# Patient Record
Sex: Female | Born: 1975 | Race: White | Hispanic: No | Marital: Single | Smoking: Former smoker
Health system: Southern US, Community
[De-identification: ages and names within clinical notes are randomized; demographics above are authoritative.]

## PROBLEM LIST (undated history)

## (undated) ENCOUNTER — Inpatient Hospital Stay (HOSPITAL_COMMUNITY): Payer: Self-pay

## (undated) DIAGNOSIS — O99345 Other mental disorders complicating the puerperium: Secondary | ICD-10-CM

## (undated) DIAGNOSIS — N390 Urinary tract infection, site not specified: Secondary | ICD-10-CM

## (undated) DIAGNOSIS — F53 Postpartum depression: Secondary | ICD-10-CM

## (undated) DIAGNOSIS — F418 Other specified anxiety disorders: Secondary | ICD-10-CM

## (undated) DIAGNOSIS — E785 Hyperlipidemia, unspecified: Secondary | ICD-10-CM

## (undated) HISTORY — DX: Other specified anxiety disorders: F41.8

## (undated) HISTORY — DX: Other mental disorders complicating the puerperium: O99.345

## (undated) HISTORY — DX: Hyperlipidemia, unspecified: E78.5

## (undated) HISTORY — DX: Postpartum depression: F53.0

---

## 2000-05-25 ENCOUNTER — Emergency Department (HOSPITAL_COMMUNITY): Admission: EM | Admit: 2000-05-25 | Discharge: 2000-05-25 | Payer: Self-pay | Admitting: Internal Medicine

## 2000-06-01 ENCOUNTER — Encounter: Admission: RE | Admit: 2000-06-01 | Discharge: 2000-06-01 | Payer: Self-pay | Admitting: Family Medicine

## 2000-06-26 ENCOUNTER — Encounter: Admission: RE | Admit: 2000-06-26 | Discharge: 2000-06-26 | Payer: Self-pay | Admitting: Sports Medicine

## 2000-07-05 ENCOUNTER — Ambulatory Visit (HOSPITAL_COMMUNITY): Admission: RE | Admit: 2000-07-05 | Discharge: 2000-07-05 | Payer: Self-pay | Admitting: Sports Medicine

## 2000-07-26 ENCOUNTER — Encounter: Admission: RE | Admit: 2000-07-26 | Discharge: 2000-07-26 | Payer: Self-pay | Admitting: Family Medicine

## 2000-08-13 ENCOUNTER — Inpatient Hospital Stay (HOSPITAL_COMMUNITY): Admission: AD | Admit: 2000-08-13 | Discharge: 2000-08-13 | Payer: Self-pay | Admitting: Obstetrics

## 2000-08-20 ENCOUNTER — Encounter: Admission: RE | Admit: 2000-08-20 | Discharge: 2000-08-20 | Payer: Self-pay | Admitting: Family Medicine

## 2000-09-04 ENCOUNTER — Encounter: Admission: RE | Admit: 2000-09-04 | Discharge: 2000-09-04 | Payer: Self-pay | Admitting: Sports Medicine

## 2000-09-20 ENCOUNTER — Inpatient Hospital Stay (HOSPITAL_COMMUNITY): Admission: AD | Admit: 2000-09-20 | Discharge: 2000-09-20 | Payer: Self-pay | Admitting: Obstetrics & Gynecology

## 2000-09-20 ENCOUNTER — Encounter: Admission: RE | Admit: 2000-09-20 | Discharge: 2000-09-20 | Payer: Self-pay | Admitting: Family Medicine

## 2000-09-28 ENCOUNTER — Encounter: Admission: RE | Admit: 2000-09-28 | Discharge: 2000-09-28 | Payer: Self-pay | Admitting: Family Medicine

## 2000-10-03 ENCOUNTER — Encounter: Payer: Self-pay | Admitting: Obstetrics

## 2000-10-03 ENCOUNTER — Inpatient Hospital Stay (HOSPITAL_COMMUNITY): Admission: AD | Admit: 2000-10-03 | Discharge: 2000-10-03 | Payer: Self-pay | Admitting: Obstetrics

## 2000-10-03 ENCOUNTER — Encounter: Admission: RE | Admit: 2000-10-03 | Discharge: 2000-10-03 | Payer: Self-pay | Admitting: Family Medicine

## 2000-10-10 ENCOUNTER — Encounter: Admission: RE | Admit: 2000-10-10 | Discharge: 2000-10-10 | Payer: Self-pay | Admitting: Family Medicine

## 2000-10-17 ENCOUNTER — Observation Stay (HOSPITAL_COMMUNITY): Admission: AD | Admit: 2000-10-17 | Discharge: 2000-10-17 | Payer: Self-pay | Admitting: *Deleted

## 2000-10-26 ENCOUNTER — Encounter: Admission: RE | Admit: 2000-10-26 | Discharge: 2000-10-26 | Payer: Self-pay | Admitting: Family Medicine

## 2000-10-29 ENCOUNTER — Inpatient Hospital Stay (HOSPITAL_COMMUNITY): Admission: AD | Admit: 2000-10-29 | Discharge: 2000-10-31 | Payer: Self-pay | Admitting: Obstetrics

## 2001-02-11 ENCOUNTER — Encounter: Admission: RE | Admit: 2001-02-11 | Discharge: 2001-02-11 | Payer: Self-pay | Admitting: Family Medicine

## 2001-03-05 ENCOUNTER — Encounter: Admission: RE | Admit: 2001-03-05 | Discharge: 2001-03-05 | Payer: Self-pay | Admitting: Family Medicine

## 2001-04-11 ENCOUNTER — Encounter: Admission: RE | Admit: 2001-04-11 | Discharge: 2001-04-11 | Payer: Self-pay | Admitting: Family Medicine

## 2001-04-16 ENCOUNTER — Ambulatory Visit (HOSPITAL_COMMUNITY): Admission: RE | Admit: 2001-04-16 | Discharge: 2001-04-16 | Payer: Self-pay | Admitting: Family Medicine

## 2001-05-03 ENCOUNTER — Encounter: Admission: RE | Admit: 2001-05-03 | Discharge: 2001-05-03 | Payer: Self-pay | Admitting: Family Medicine

## 2001-07-01 ENCOUNTER — Emergency Department (HOSPITAL_COMMUNITY): Admission: EM | Admit: 2001-07-01 | Discharge: 2001-07-01 | Payer: Self-pay | Admitting: Emergency Medicine

## 2001-10-03 ENCOUNTER — Encounter: Admission: RE | Admit: 2001-10-03 | Discharge: 2001-10-03 | Payer: Self-pay | Admitting: Family Medicine

## 2001-12-13 ENCOUNTER — Other Ambulatory Visit: Admission: RE | Admit: 2001-12-13 | Discharge: 2001-12-13 | Payer: Self-pay | Admitting: Family Medicine

## 2001-12-13 ENCOUNTER — Encounter: Admission: RE | Admit: 2001-12-13 | Discharge: 2001-12-13 | Payer: Self-pay | Admitting: Family Medicine

## 2001-12-13 ENCOUNTER — Encounter (INDEPENDENT_AMBULATORY_CARE_PROVIDER_SITE_OTHER): Payer: Self-pay | Admitting: Specialist

## 2001-12-18 ENCOUNTER — Ambulatory Visit (HOSPITAL_COMMUNITY): Admission: RE | Admit: 2001-12-18 | Discharge: 2001-12-18 | Payer: Self-pay | Admitting: Internal Medicine

## 2002-01-12 ENCOUNTER — Inpatient Hospital Stay (HOSPITAL_COMMUNITY): Admission: AD | Admit: 2002-01-12 | Discharge: 2002-01-12 | Payer: Self-pay | Admitting: *Deleted

## 2002-01-16 ENCOUNTER — Inpatient Hospital Stay (HOSPITAL_COMMUNITY): Admission: AD | Admit: 2002-01-16 | Discharge: 2002-01-18 | Payer: Self-pay | Admitting: *Deleted

## 2002-01-17 ENCOUNTER — Encounter: Payer: Self-pay | Admitting: *Deleted

## 2002-03-12 ENCOUNTER — Encounter: Admission: RE | Admit: 2002-03-12 | Discharge: 2002-03-12 | Payer: Self-pay | Admitting: Family Medicine

## 2002-03-12 ENCOUNTER — Other Ambulatory Visit: Admission: RE | Admit: 2002-03-12 | Discharge: 2002-03-12 | Payer: Self-pay | Admitting: Family Medicine

## 2002-03-12 ENCOUNTER — Encounter (INDEPENDENT_AMBULATORY_CARE_PROVIDER_SITE_OTHER): Payer: Self-pay | Admitting: *Deleted

## 2002-03-28 ENCOUNTER — Encounter: Admission: RE | Admit: 2002-03-28 | Discharge: 2002-03-28 | Payer: Self-pay | Admitting: Family Medicine

## 2002-04-09 ENCOUNTER — Encounter: Admission: RE | Admit: 2002-04-09 | Discharge: 2002-04-09 | Payer: Self-pay | Admitting: Family Medicine

## 2002-05-13 ENCOUNTER — Encounter: Admission: RE | Admit: 2002-05-13 | Discharge: 2002-05-13 | Payer: Self-pay | Admitting: Family Medicine

## 2002-05-17 ENCOUNTER — Inpatient Hospital Stay (HOSPITAL_COMMUNITY): Admission: AD | Admit: 2002-05-17 | Discharge: 2002-05-19 | Payer: Self-pay | Admitting: *Deleted

## 2003-02-16 ENCOUNTER — Encounter: Admission: RE | Admit: 2003-02-16 | Discharge: 2003-02-16 | Payer: Self-pay | Admitting: Family Medicine

## 2003-02-24 ENCOUNTER — Encounter: Admission: RE | Admit: 2003-02-24 | Discharge: 2003-02-24 | Payer: Self-pay | Admitting: Family Medicine

## 2003-03-04 ENCOUNTER — Encounter (INDEPENDENT_AMBULATORY_CARE_PROVIDER_SITE_OTHER): Payer: Self-pay | Admitting: *Deleted

## 2003-03-04 LAB — CONVERTED CEMR LAB

## 2003-03-10 ENCOUNTER — Encounter (INDEPENDENT_AMBULATORY_CARE_PROVIDER_SITE_OTHER): Payer: Self-pay | Admitting: Specialist

## 2003-03-10 ENCOUNTER — Encounter: Admission: RE | Admit: 2003-03-10 | Discharge: 2003-03-10 | Payer: Self-pay | Admitting: Sports Medicine

## 2003-03-10 ENCOUNTER — Other Ambulatory Visit: Admission: RE | Admit: 2003-03-10 | Discharge: 2003-03-10 | Payer: Self-pay | Admitting: Family Medicine

## 2003-03-31 ENCOUNTER — Encounter: Admission: RE | Admit: 2003-03-31 | Discharge: 2003-03-31 | Payer: Self-pay | Admitting: Family Medicine

## 2003-04-15 ENCOUNTER — Encounter: Admission: RE | Admit: 2003-04-15 | Discharge: 2003-04-15 | Payer: Self-pay | Admitting: Family Medicine

## 2003-05-26 ENCOUNTER — Encounter: Admission: RE | Admit: 2003-05-26 | Discharge: 2003-05-26 | Payer: Self-pay | Admitting: Obstetrics and Gynecology

## 2003-06-29 ENCOUNTER — Ambulatory Visit (HOSPITAL_COMMUNITY): Admission: RE | Admit: 2003-06-29 | Discharge: 2003-06-29 | Payer: Self-pay | Admitting: Obstetrics and Gynecology

## 2003-06-29 ENCOUNTER — Encounter (INDEPENDENT_AMBULATORY_CARE_PROVIDER_SITE_OTHER): Payer: Self-pay | Admitting: Specialist

## 2003-10-05 ENCOUNTER — Encounter: Admission: RE | Admit: 2003-10-05 | Discharge: 2003-10-05 | Payer: Self-pay | Admitting: Family Medicine

## 2003-10-22 ENCOUNTER — Encounter: Admission: RE | Admit: 2003-10-22 | Discharge: 2003-10-22 | Payer: Self-pay | Admitting: Obstetrics and Gynecology

## 2004-01-05 ENCOUNTER — Ambulatory Visit: Payer: Self-pay | Admitting: Obstetrics and Gynecology

## 2004-04-19 ENCOUNTER — Encounter (INDEPENDENT_AMBULATORY_CARE_PROVIDER_SITE_OTHER): Payer: Self-pay | Admitting: *Deleted

## 2004-04-19 ENCOUNTER — Ambulatory Visit: Payer: Self-pay | Admitting: Obstetrics and Gynecology

## 2004-10-04 ENCOUNTER — Ambulatory Visit: Payer: Self-pay | Admitting: Obstetrics & Gynecology

## 2004-10-04 ENCOUNTER — Encounter (INDEPENDENT_AMBULATORY_CARE_PROVIDER_SITE_OTHER): Payer: Self-pay | Admitting: Specialist

## 2004-10-14 ENCOUNTER — Ambulatory Visit: Payer: Self-pay | Admitting: Family Medicine

## 2004-10-18 ENCOUNTER — Ambulatory Visit: Payer: Self-pay | Admitting: Sports Medicine

## 2005-01-31 ENCOUNTER — Encounter (INDEPENDENT_AMBULATORY_CARE_PROVIDER_SITE_OTHER): Payer: Self-pay | Admitting: *Deleted

## 2005-01-31 ENCOUNTER — Ambulatory Visit: Payer: Self-pay | Admitting: Obstetrics and Gynecology

## 2005-10-22 ENCOUNTER — Emergency Department (HOSPITAL_COMMUNITY): Admission: EM | Admit: 2005-10-22 | Discharge: 2005-10-22 | Payer: Self-pay | Admitting: Emergency Medicine

## 2006-04-27 ENCOUNTER — Encounter (INDEPENDENT_AMBULATORY_CARE_PROVIDER_SITE_OTHER): Payer: Self-pay | Admitting: *Deleted

## 2006-10-23 ENCOUNTER — Emergency Department (HOSPITAL_COMMUNITY): Admission: EM | Admit: 2006-10-23 | Discharge: 2006-10-23 | Payer: Self-pay | Admitting: Emergency Medicine

## 2008-06-28 ENCOUNTER — Inpatient Hospital Stay (HOSPITAL_COMMUNITY): Admission: AD | Admit: 2008-06-28 | Discharge: 2008-06-28 | Payer: Self-pay | Admitting: Obstetrics & Gynecology

## 2008-06-28 ENCOUNTER — Encounter: Payer: Self-pay | Admitting: Family Medicine

## 2008-07-14 ENCOUNTER — Ambulatory Visit: Payer: Self-pay | Admitting: Family Medicine

## 2008-07-14 ENCOUNTER — Encounter: Payer: Self-pay | Admitting: Family Medicine

## 2008-07-14 LAB — CONVERTED CEMR LAB
Antibody Screen: NEGATIVE
Basophils Absolute: 0 10*3/uL (ref 0.0–0.1)
Basophils Relative: 0 % (ref 0–1)
Eosinophils Absolute: 0.1 10*3/uL (ref 0.0–0.7)
Eosinophils Relative: 2 % (ref 0–5)
HCT: 36.6 % (ref 36.0–46.0)
Hemoglobin: 12.4 g/dL (ref 12.0–15.0)
Hepatitis B Surface Ag: NEGATIVE
Lymphocytes Relative: 23 % (ref 12–46)
Lymphs Abs: 1.5 10*3/uL (ref 0.7–4.0)
MCHC: 33.9 g/dL (ref 30.0–36.0)
MCV: 87.6 fL (ref 78.0–100.0)
Monocytes Absolute: 0.4 10*3/uL (ref 0.1–1.0)
Monocytes Relative: 6 % (ref 3–12)
Neutro Abs: 4.6 10*3/uL (ref 1.7–7.7)
Neutrophils Relative %: 69 % (ref 43–77)
Platelets: 268 10*3/uL (ref 150–400)
RBC: 4.18 M/uL (ref 3.87–5.11)
RDW: 13.7 % (ref 11.5–15.5)
Rh Type: POSITIVE
Rubella: 59.2 intl units/mL — ABNORMAL HIGH
Sickle Cell Screen: NEGATIVE
WBC: 6.6 10*3/uL (ref 4.0–10.5)

## 2008-07-21 ENCOUNTER — Encounter: Payer: Self-pay | Admitting: Family Medicine

## 2008-07-21 ENCOUNTER — Ambulatory Visit: Payer: Self-pay | Admitting: Family Medicine

## 2008-07-21 ENCOUNTER — Other Ambulatory Visit: Admission: RE | Admit: 2008-07-21 | Discharge: 2008-07-21 | Payer: Self-pay | Admitting: Family Medicine

## 2008-07-21 DIAGNOSIS — N390 Urinary tract infection, site not specified: Secondary | ICD-10-CM | POA: Insufficient documentation

## 2008-07-21 LAB — CONVERTED CEMR LAB
Chlamydia, DNA Probe: NEGATIVE
GC Probe Amp, Genital: NEGATIVE

## 2008-07-23 ENCOUNTER — Encounter: Payer: Self-pay | Admitting: Family Medicine

## 2008-08-11 ENCOUNTER — Encounter: Payer: Self-pay | Admitting: Family Medicine

## 2008-08-11 ENCOUNTER — Ambulatory Visit: Payer: Self-pay | Admitting: Family Medicine

## 2008-08-11 LAB — CONVERTED CEMR LAB
Bilirubin Urine: NEGATIVE
Blood in Urine, dipstick: NEGATIVE
Epithelial cells, urine: >20 /LPF
Glucose, Urine, Semiquant: NEGATIVE
Ketones, urine, test strip: NEGATIVE
Nitrite: NEGATIVE
Protein, U semiquant: 30
Specific Gravity, Urine: 1.02
Urobilinogen, UA: 0.2
WBC Urine, dipstick: NEGATIVE
pH: 7.5

## 2008-08-27 ENCOUNTER — Encounter: Payer: Self-pay | Admitting: *Deleted

## 2008-09-02 ENCOUNTER — Encounter: Payer: Self-pay | Admitting: Family Medicine

## 2008-09-02 ENCOUNTER — Ambulatory Visit (HOSPITAL_COMMUNITY): Admission: RE | Admit: 2008-09-02 | Discharge: 2008-09-02 | Payer: Self-pay | Admitting: Family Medicine

## 2008-09-03 ENCOUNTER — Encounter: Payer: Self-pay | Admitting: Family Medicine

## 2008-09-03 ENCOUNTER — Ambulatory Visit: Payer: Self-pay | Admitting: Family Medicine

## 2008-10-26 ENCOUNTER — Ambulatory Visit: Payer: Self-pay | Admitting: Family Medicine

## 2008-10-26 ENCOUNTER — Encounter: Payer: Self-pay | Admitting: Family Medicine

## 2008-10-26 LAB — CONVERTED CEMR LAB
HCT: 34.5 % — ABNORMAL LOW (ref 36.0–46.0)
Hemoglobin: 10.9 g/dL — ABNORMAL LOW (ref 12.0–15.0)
MCHC: 31.6 g/dL (ref 30.0–36.0)
MCV: 92 fL (ref 78.0–100.0)
Platelets: 266 10*3/uL (ref 150–400)
RBC: 3.75 M/uL — ABNORMAL LOW (ref 3.87–5.11)
RDW: 14.1 % (ref 11.5–15.5)
WBC: 7.1 10*3/uL (ref 4.0–10.5)

## 2008-11-09 ENCOUNTER — Ambulatory Visit: Payer: Self-pay | Admitting: Family Medicine

## 2008-11-24 ENCOUNTER — Encounter: Payer: Self-pay | Admitting: Family Medicine

## 2008-11-24 ENCOUNTER — Ambulatory Visit: Payer: Self-pay | Admitting: Family Medicine

## 2008-12-07 ENCOUNTER — Inpatient Hospital Stay (HOSPITAL_COMMUNITY): Admission: AD | Admit: 2008-12-07 | Discharge: 2008-12-07 | Payer: Self-pay | Admitting: Obstetrics & Gynecology

## 2008-12-07 ENCOUNTER — Ambulatory Visit: Payer: Self-pay | Admitting: Obstetrics and Gynecology

## 2008-12-09 ENCOUNTER — Ambulatory Visit: Payer: Self-pay | Admitting: Family Medicine

## 2008-12-18 ENCOUNTER — Encounter: Payer: Self-pay | Admitting: Family Medicine

## 2008-12-18 ENCOUNTER — Ambulatory Visit: Payer: Self-pay | Admitting: Family Medicine

## 2008-12-18 LAB — CONVERTED CEMR LAB
Chlamydia, DNA Probe: NEGATIVE
GC Probe Amp, Genital: NEGATIVE

## 2008-12-19 ENCOUNTER — Ambulatory Visit: Payer: Self-pay | Admitting: Advanced Practice Midwife

## 2008-12-19 ENCOUNTER — Inpatient Hospital Stay (HOSPITAL_COMMUNITY): Admission: AD | Admit: 2008-12-19 | Discharge: 2008-12-19 | Payer: Self-pay | Admitting: Family Medicine

## 2008-12-30 ENCOUNTER — Ambulatory Visit: Payer: Self-pay | Admitting: Family Medicine

## 2009-01-05 ENCOUNTER — Inpatient Hospital Stay (HOSPITAL_COMMUNITY): Admission: AD | Admit: 2009-01-05 | Discharge: 2009-01-06 | Payer: Self-pay | Admitting: Obstetrics & Gynecology

## 2009-01-05 ENCOUNTER — Ambulatory Visit: Payer: Self-pay | Admitting: Obstetrics and Gynecology

## 2009-01-11 ENCOUNTER — Encounter: Payer: Self-pay | Admitting: Family Medicine

## 2009-01-11 ENCOUNTER — Telehealth: Payer: Self-pay | Admitting: *Deleted

## 2009-02-10 ENCOUNTER — Encounter: Payer: Self-pay | Admitting: Family Medicine

## 2009-02-10 ENCOUNTER — Ambulatory Visit: Payer: Self-pay | Admitting: Family Medicine

## 2009-02-10 LAB — CONVERTED CEMR LAB
Chlamydia, DNA Probe: NEGATIVE
GC Probe Amp, Genital: NEGATIVE
Whiff Test: POSITIVE

## 2009-02-16 ENCOUNTER — Encounter: Payer: Self-pay | Admitting: Family Medicine

## 2009-04-20 ENCOUNTER — Encounter: Payer: Self-pay | Admitting: Family Medicine

## 2009-04-20 ENCOUNTER — Ambulatory Visit (HOSPITAL_COMMUNITY): Admission: RE | Admit: 2009-04-20 | Discharge: 2009-04-20 | Payer: Self-pay | Admitting: Family Medicine

## 2009-04-20 ENCOUNTER — Ambulatory Visit: Payer: Self-pay | Admitting: Family Medicine

## 2009-04-20 DIAGNOSIS — R079 Chest pain, unspecified: Secondary | ICD-10-CM | POA: Insufficient documentation

## 2009-04-20 LAB — CONVERTED CEMR LAB: Pro B Natriuretic peptide (BNP): 12.6 pg/mL (ref 0.0–100.0)

## 2009-04-22 ENCOUNTER — Encounter: Payer: Self-pay | Admitting: Family Medicine

## 2009-05-13 ENCOUNTER — Ambulatory Visit: Payer: Self-pay | Admitting: Family Medicine

## 2009-06-09 ENCOUNTER — Telehealth: Payer: Self-pay | Admitting: Family Medicine

## 2009-06-14 ENCOUNTER — Encounter: Payer: Self-pay | Admitting: Family Medicine

## 2009-06-14 ENCOUNTER — Ambulatory Visit: Payer: Self-pay | Admitting: Family Medicine

## 2009-07-20 ENCOUNTER — Emergency Department (HOSPITAL_COMMUNITY): Admission: EM | Admit: 2009-07-20 | Discharge: 2009-07-20 | Payer: Self-pay | Admitting: Emergency Medicine

## 2009-08-03 ENCOUNTER — Encounter: Payer: Self-pay | Admitting: *Deleted

## 2009-08-03 ENCOUNTER — Encounter: Payer: Self-pay | Admitting: Family Medicine

## 2009-08-09 ENCOUNTER — Telehealth: Payer: Self-pay | Admitting: *Deleted

## 2009-10-08 ENCOUNTER — Emergency Department (HOSPITAL_COMMUNITY): Admission: EM | Admit: 2009-10-08 | Discharge: 2009-10-08 | Payer: Self-pay | Admitting: Emergency Medicine

## 2009-10-08 ENCOUNTER — Telehealth (INDEPENDENT_AMBULATORY_CARE_PROVIDER_SITE_OTHER): Payer: Self-pay | Admitting: *Deleted

## 2010-03-28 ENCOUNTER — Ambulatory Visit: Admission: RE | Admit: 2010-03-28 | Discharge: 2010-03-28 | Payer: Self-pay | Source: Home / Self Care

## 2010-03-28 DIAGNOSIS — F411 Generalized anxiety disorder: Secondary | ICD-10-CM | POA: Insufficient documentation

## 2010-03-29 NOTE — Progress Notes (Signed)
Summary: phn msg  Phone Note Call from Patient Call back at [phone]   Caller: Patient Summary of Call: wants to know how soon she can have her tubes tied Initial call taken by: De Nurse,  June 09, 2009 1:34 PM  Follow-up for Phone Call        [facility] Team:  please set up referral to [facility] clinic for tubal ligation.  Order entered. Follow-up by: Romero Belling MD,  June 10, 2009 11:50 AM  New Problems: CONTRACEPTIVE MANAGEMENT (ICD-V25.09)   New Problems: CONTRACEPTIVE MANAGEMENT (ICD-V25.09) will schedule appointment. Theresia Lo RN  June 10, 2009 5:03 PM

## 2010-03-29 NOTE — Assessment & Plan Note (Signed)
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 : approximate (month known)    Menarche: 12 years    Menses interval: irregular days    Menstrual flow irregular days    On BCP's at conception: no    Symptoms since LMP: amenorrhea, nausea, vomiting, fatigue, tender breasts, urinary frequency  Physical Exam  General:  Well-developed,well-nourished,in no acute distress; alert,appropriate and cooperative throughout examination Head:  Normocephalic and atraumatic without obvious abnormalities. No apparent alopecia or balding. Eyes:  PERRL, EOMI Ears:  External ear exam shows no significant lesions or deformities.  Otoscopic examination reveals clear canals, tympanic membranes are intact bilaterally without bulging, retraction, inflammation or discharge. Hearing is grossly normal bilaterally. Nose:  External nasal examination shows no deformity or inflammation. Nasal mucosa are pink and moist without lesions or exudates. Mouth:  Oral mucosa and oropharynx without lesions or exudates.  Teeth in good repair. Neck:  No deformities, masses, or tenderness noted. Lungs:  Clear to auscultation bilateral with normal work of breathing. Heart:  Regular rate and rhythm without murmurs, rubs, or gallops.  Normal precordium. Abdomen:  Bowel sounds positive,abdomen soft and non-tender without masses, organomegaly or hernias noted. Genitalia:  Pelvic Exam:        External: normal female genitalia without lesions or masses        Vagina: normal without lesions or masses        Cervix: normal without lesions  or masses        Adnexa: normal bimanual exam without masses or fullness        Uterus: normal by palpation        Pap smear: performed Msk:  No deformity or scoliosis noted of thoracic or lumbar spine.   Extremities:  No edema or tenderness in bilateral lower extremities. Neurologic:  CN II-XII intact, 5/5 strength x4 extremities, 2+ DTRs x4 extremities, no resting or intention tremor. Skin:  Intact without suspicious lesions or rashes Cervical Nodes:  No lymphadenopathy noted Psych:  Cognition and judgment appear intact. Alert and cooperative with normal attention span and concentration. No apparent delusions, illusions, hallucinations   Vital Signs:  Patient profile:   35 year old female Height:      66 inches Weight:      150.3 pounds Pulse rate:   87 / minute BP sitting:   102 / 72  (left arm) Cuff size:   regular  Vitals Entered By: Garen Grams LPN (Jul 21, 2008 4:16 PM)  CC: NOB Pain Assessment Patient in pain? no      EDC 01/10/2009 LMP - Reliable? approximate (month known) Menarche (age onset): 12 years  Menses interval: irregular days  Menstrual flow (days) irregular On BCP's at conception: no Last PAP Result Done.   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  Employed.  Divorced.  Tobacco 1/3 ppd. (04/26/2006)  Past Pregnancy History    Gravida:     7    Term Births:     3    Premature Births:   0    Living Children:   3    Para:       3    Mult. Births:     0    Prev C-Section:   0    Aborta:     3    Elect. Ab:     3    Spont. Ab:     0    Ectopics:     0  Pregnancy # 1    Delivery date:     01/21/1996    Weeks Gestation:   41    Preterm labor:     no    Delivery type:     NSVD    Hours of labor:     5    Anesthesia type:      None    Delivery location:     [facility]    Infant Sex:     Female    Birth weight:     7#14    Name:     Passion  Pregnancy # 2    Delivery date:     10/29/2000    Weeks Gestation:   38    Preterm labor:     no    Delivery type:     NSVD    Hours of labor:     3    Anesthesia type:     None    Delivery location:     [facility]    Infant Sex:     Female    Birth weight:     6#14    Name:     Dorathy Daft    Comments:     Laceration  Pregnancy # 3    Delivery date:     01/27/2001    Delivery type:     TAB  Pregnancy # 4    Delivery date:     07/28/2001    Delivery type:     TAB  Pregnancy # 5    Delivery date:     05/17/2002    Weeks Gestation:   40    Preterm labor:     no    Delivery type:     NSVD    Hours of labor:     1    Anesthesia type:     None    Delivery location:     [facility]    Infant Sex:     Female    Birth weight:     7#10    Name:     Thermon Leyland  Pregnancy # 6    Delivery date:     07/29/2003    Delivery type:     TAB  Pregnancy # 7    Comments:     Current pregnancy  Social History:    Occupation:  [place]    Education:  College    Hepatitis Risk:  no    Packs/Day:  n/a  Risk Factors:   Genetic History    Father of baby:   Brendia Sacks    FOB Family Hx:     Unknown     Thalassemia:     mother: no    Neural tube defect:   mother: yes  Down's Syndrome:   mother: no    Tay-Sachs:     mother: no    Sickle Cell Dz/Trait:   mother: no    Hemophilia:     mother: no    Muscular Dystrophy:   mother: no    Cystic Fibrosis:   mother: no    Huntington's Dz:   mother: no    Mental Retardation:   mother: no    Fragile X:     mother: no    Other Genetic or       Chromosomal Dz:   mother: no    Child with other       birth defect:     mother: no    > 3 spont. abortions:   mother: no    Hx of stillbirth:     mother: no  Infection Risk History    High Risk Hepatitis B: no    Immunized against Hepatitis B: no    Exposure to TB: no    Patient  with history of Genital Herpes: no    Sexual partner with history of Genital Herpes: no    History of STD (GC, Chlamydia, Syphilis, HPV): yes    Specific STD: Chlamydia, 1994    Rash, Viral, or Febrile Illness since LMP: no    Exposure to Cat Litter: no    History of Parvovirus (Fifth Disease): no    Occupational Exposure to Children: other  Environmental Exposures    Xray Exposure since LMP: no    Chemical or other exposure: no    Medication, drug, or alcohol use since LMP: no  Flowsheet View for Follow-up Visit    Estimated weeks of       gestation:     15 2/7    Weight:     150.3    Blood pressure:   102 / 72    Hx headache?     few    Nausea/vomiting?   nausea    Edema?     0    Bleeding?     no    Leakage/discharge?   no    Fetal activity:       no    Labor symptoms?   no    Fundal height:      3 cm above pubis    FHR:       130s    Fetal position:      N/A    Cx dilation:     0    Cx effacement:   0%    Fetal station:     high    Taking Vitamins?   Y    Smoking PPD:   n/a    Next visit:     4 wk    Resident:     mdo    Preceptor:     neal  Physical Examination  Vital Signs:  P: 87  BP (upright): 102/72  Wt: 150.3  Ht: 66  Impression & Recommendations:  Problem # 1:  PREGNANCY (ICD-V22.2) Assessment Unchanged 30 [id number], EDC 01/10/2009 (unk LMP, 12 wk U/S) O pos, Ab neg, RPR nr, HIV nr, HBsAg neg, Sickle neg Urinary Tract Infection, Enterobacter --> Rx with 1 gram Ceftriaxone IM x1 on 07/21/2008 Orders: Medicaid OB visit - [facility] (45409) GC/Chlamydia-[facility] (87591/87491) Pap Smear-[facility] ([id number])Future Orders: AFP/Quad Scr-[facility] ([id number]) ... 07/21/2009  Complete Medication List: 1)  Prenatal Vitamins 0.8 Mg Tabs (Prenatal multivit-min-fe-fa) .Marland Kitchen.. 1 tab by mouth daily while pregnant or nursing  Other Orders: Future Orders: Urinalysis-[facility] (00000) ... 07/21/2009 Urine Culture-[facility] ([id number]) ... 07/21/2009  Patient Instructions: 1)  Please return  in 1 week for lab visit only:  Urinalysis, Urine Culture, Quad Screen. 2)  Please schedule a follow-up appointment in 4 weeks. Prenatal Visit    FOB name: Brendia Sacks [facility] Confirmation:    New working [facility]: 01/10/2009    LMP reliable? approximate (month known) Ultrasound Dating Information:    First U/S on 06/28/2008   Gest age: 36w0   EDC: 01/10/2009.    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 : approximate (month known)    Menarche: 12 years    Menses interval: irregular days    Menstrual flow irregular days    On BCP's at conception: no    Symptoms since LMP: amenorrhea, nausea, vomiting, fatigue, tender breasts, urinary frequency   Flowsheet View for Follow-up Visit    Estimated weeks of       gestation:     15 2/7    Weight:     150.3    Blood pressure:   102 / 72    Headache:     few    Nausea/vomiting:   nausea    Edema:     0    Vaginal bleeding:   no    Vaginal discharge:   no    Fundal height:      3 cm above pubis    FHR:       130s    Fetal activity:     no    Labor symptoms:   no    Fetal position:     N/A    Cx Dilation:     0    Cx Effacement:   0%    Cx Station:     high    Taking prenatal vits?   Y    Smoking:     n/a    Next visit:     4 wk    Resident:     mdo    Preceptor:     neal  Appended Document: NOB 15.2   Medication Administration  Injection # 1:    Medication: Rocephin  250mg     Diagnosis: UTI'S, RECURRENT (ICD-599.0)    Route: IM    Site: RUOQ gluteus    Exp Date: 09/28/2010    Lot #: [id number]    Mfr: sandoz    Comments: 1 gram given     Patient tolerated injection without complications    Given by: Jone Baseman CMA (Jul 21, 2008 5:28 PM)  Orders Added: 1)  Rocephin  250mg  [[id number]]

## 2010-03-29 NOTE — Assessment & Plan Note (Signed)
Summary: ob f/u   Flowsheet View for Follow-up Visit    Estimated weeks of       gestation:     36 5/7    Weight:     168.4    Blood pressure:   108 / 73    Hx headache?     few    Nausea/vomiting?   No    Edema?     0    Bleeding?     no    Leakage/discharge?   no    Fetal activity:       yes    Labor symptoms?   few ctx    Fundal height:      35    FHR:       150s    Cx dilation:     1    Cx effacement:   20%    Fetal station:     -3    Taking Vitamins?   Y    Smoking PPD:   n/a    Next visit:     1 wk    Resident:     mdo    Preceptor:     hale  Physical Examination  Vital Signs:  P: 75  BP (upright): 108/73  Wt: 168.4  Last Ht: 66 (12/09/2008)  Impression & Recommendations:  Problem # 1:  PREGNANCY (ICD-V22.2) Assessment Unchanged  30 [id number] (TAB x3), EDC 01/10/2009 (unk LMP, 12 wk U/S) O pos, Ab neg, RPR nr, HIV nr, HBsAg neg, Sickle neg, 1h GCT 105, Hgb 10.9. Quad Screen Negative, but patient with 3 children with NTD so elevated risk.  Patient has declined further workup. Urinary Tract Infection, Enterobacter --> Rx with 1 gram Ceftriaxone IM x1 on 07/21/2008.  Cx neg (08/11/2008). Infant:  Female, name undecided, bottle feed Contraception:  None--wants another baby "unless this one hurts"  Orders: GC/Chlamydia-[facility] (87591/87491) Grp B Probe-[facility] ([id number]) Wet Prep- [facility] (47829) Medicaid OB visit - [facility] ([id number])  Complete Medication List: 1)  Cvs Childrens Complete 60 Mg Chew (Pediatric multivit-minerals-c) .... 2 tabs by mouth once daily 2)  Iron 325 (65 Fe) Mg Tabs (Ferrous sulfate) .... 1/2 tab by mouth two times a day for anemia  Patient Instructions: 1)  Every morning and every evening evaluate whether you think the baby is moving enough.  If not, then do kick counts as follows.  Sit in a quiet place and count each movement.  If you get to 5 baby movements in an hour you can stop.  If at 1 hour you have less than 5 movements, keep counting for another  hour.  If you don't get 10 movements in these 2 hours, you should go to the MAU or call the clinic. 2)  If you have vaginal bleeding, suspect your water has broken (large gush of fluid or continuous slow trickle from vagina) or you have more than 5 contractions per hour for 2 hours straight, please go directly to the MAU. 3)  Please start taking iron pills. 4)  Please schedule a follow-up appointment in 1 week with Dr. Ayesha Mohair Marliss Czar).  Flowsheet View for Follow-up Visit    Estimated weeks of       gestation:     36 5/7    Weight:     168.4    Blood pressure:   108 / 73    Headache:     few    Nausea/vomiting:   No    Edema:  0    Vaginal bleeding:   no    Vaginal discharge:   no    Fundal height:      35    FHR:       150s    Fetal activity:     yes    Labor symptoms:   few ctx    Cx Dilation:     1    Cx Effacement:   20%    Cx Station:     -3    Taking prenatal vits?   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

## 2010-03-29 NOTE — Progress Notes (Signed)
Summary: Phn Msg  Phone Note Call from Patient Call back at Home Phone [phone]   Caller: Patient Summary of Call: Pt had baby on Tues the 9th and she is asking to be released to go back to work now.   Initial call taken by: Clydell Hakim,  January 11, 2009 8:59 AM  Follow-up for Phone Call        Okay to return to work.  No heavy lifting.  I've left a note at the front desk for her in case she needs it. Follow-up by: Romero Belling MD,  January 11, 2009 4:05 PM  Additional Follow-up for Phone Call Additional follow up Details #1::        Patient informed, will be by on the 18th to pick up note. Additional Follow-up by: Garen Grams LPN,  January 11, 2009 4:10 PM

## 2010-03-29 NOTE — Assessment & Plan Note (Signed)
Summary: ob visit/eo   Vital Signs:  Patient profile:   34 year old female Height:      66 inches Weight:      159.1 pounds BMI:     25.77 Pulse rate:   90 / minute BP sitting:   99 / 63  (left arm) Cuff size:   regular  Vitals Entered By: Garen Grams LPN (October 26, 2008 10:33 AM)  Habits & Providers  Alcohol-Tobacco-Diet     Cigarette Packs/Day: n/a   Impression & Recommendations:  Problem # 1:  PREGNANCY (ICD-V22.2) 30 [id number] (TAB x3), EDC 01/10/2009 (unk LMP, 12 wk U/S) O pos, Ab neg, RPR nr, HIV nr, HBsAg neg, Sickle neg Quad Screen Negative, but patient with 3 children with NTD so elevated risk.  Patient has declined further workup. Urinary Tract Infection, Enterobacter --> Rx with 1 gram Ceftriaxone IM x1 on 07/21/2008.  Cx neg (08/11/2008). Infant:  Female, name undecided, bottle feed Contraception:  None--wants another baby "unless this one hurts"  Orders: CBC-[facility] (59563) HIV-[facility] ([id number]) RPR-[facility] ([id number]) Medicaid OB visit - [facility] (99213)Future Orders: Glucose 1 hr-[facility] (01601) ... 10/26/2009  Complete Medication List: 1)  Cvs Childrens Complete 60 Mg Chew (Pediatric multivit-minerals-c) .... 2 tabs by mouth once daily  Patient Instructions: 1)  Every morning and every evening evaluate whether you think the baby is moving enough.  If not, then do kick counts as follows.  Sit in a quiet place and count each movement.  If you get to 5 baby movements in an hour you can stop.  If at 1 hour you have less than 5 movements, keep counting for another hour.  If you don't get 10 movements in these 2 hours, you should go to the MAU or call the clinic. 2)  If you have vaginal bleeding, suspect your water has broken (large gush of fluid or continuous slow trickle from vagina) or you have more than 5 contractions per hour for 2 hours straight, please go directly to the MAU. 3)  Please schedule a LAB APPOINTMENT for your sugar test next week. 4)  Please schedule a  follow-up appointment in 2 weeks with Dr. Marliss Czar.   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    [facility] delivery[facility]  FOB Information    Husband/Father of baby: Brendia Sacks    FOB occupation Holiday representative  Menstrual History    Menarche: 12 years    Menses interval: irregular days    Menstrual flow irregular days    On BCP's at conception: no   Marketing executive for Follow-up Visit    Estimated weeks of       gestation:     29 1/7    Weight:     159.1    Blood pressure:   99 / 63    Headache:     few    Nausea/vomiting:   No    Edema:     0    Vaginal bleeding:   no    Vaginal discharge:   no    Fundal height:      28    FHR:       140s    Fetal activity:     yes  Labor symptoms:   no    Fetal position:     N/A    Taking prenatal vits?   N    Smoking:     n/a    Next visit:     2 wk    Resident:     mdo    Preceptor:     chambliss

## 2010-03-29 NOTE — Assessment & Plan Note (Signed)
Summary: OB 33 and 3/7 weeks   Vital Signs:  Patient profile:   35 year old female Weight:      170 pounds BP sitting:   115 / 78  Habits & Providers  Alcohol-Tobacco-Diet     Cigarette Packs/Day: n/a  Allergies: No Known Drug Allergies   Complete Medication List: 1)  Cvs Childrens Complete 60 Mg Chew (Pediatric multivit-minerals-c) .... 2 tabs by mouth once daily 2)  Iron 325 (65 Fe) Mg Tabs (Ferrous sulfate) .... 1/2 tab by mouth two times a day for anemia  Other Orders: Medicaid OB visit - [facility] ([address])  Prenatal Visit Concerns noted: Hving ctx q 20 minutes for 2 months.  Plans to bottle feed, does not want to discuss birth control "I know how to stop it."   Flowsheet View for Follow-up Visit    Estimated weeks of       gestation:     38 3/7    Weight:     170    Blood pressure:   115 / 78    Headache:     No    Nausea/vomiting:   No    Edema:     0    Vaginal bleeding:   no    Vaginal discharge:   no    Fundal height:      39    FHR:       130s    Fetal activity:     yes    Labor symptoms:   few ctx    Taking prenatal vits?   Y    Smoking:     n/a    Next visit:     1 wk    Preceptor:     [place]

## 2010-03-29 NOTE — Letter (Signed)
Summary: Out of Work  [facility]  [address]   [place], [place] [address]   Phone: [phone]  Fax: [phone]    June 14, 2009   Employee:  [name]    To Whom It May Concern:   For Medical reasons, please excuse the above named employee from work for the following dates:  Start: 06/14/09  Back to work: 06/15/09  If you need additional information, please feel free to contact our office.         Sincerely,    Bobby Rumpf  MD

## 2010-03-29 NOTE — Assessment & Plan Note (Signed)
Summary: poison ivy? /[place]/olson   Vital Signs:  Patient profile:   35 year old female Height:      66 inches Weight:      165.5 pounds BMI:     26.81 Pulse rate:   105 / minute BP sitting:   114 / 75  (left arm) Cuff size:   regular  Vitals Entered By: Garen Grams LPN (June 14, 2009 9:17 AM) CC: poison ivy all over Is Patient Diabetic? No Pain Assessment Patient in pain? no        Primary Care Provider:  Romero Belling MD  CC:  poison ivy all over.  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 Has tried calomine lotion, unadulterated bleach, various other lotions. Denies dyspnea, itchy eyes, fever, sick contact, tick nite.   Habits & Providers  Alcohol-Tobacco-Diet     Tobacco Status: never  Current Medications (verified): 1)  Cvs Childrens Complete 60 Mg Chew (Pediatric Multivit-Minerals-C) .... 2 Tabs By Mouth Once Daily 2)  Iron 325 (65 Fe) Mg Tabs (Ferrous Sulfate) .... 1/2 Tab By Mouth Two Times A Day For Anemia 3)  Clobetasol Propionate 0.05 % Oint (Clobetasol Propionate) .... Apply To Affected Areas Two Times A Day Until Improved. Disp 60 Gram Tube  Allergies (verified): No Known Drug Allergies  Social History: Smoking Status:  never  Physical Exam  General:  Well-developed,well-nourished,in no acute distress; alert,appropriate and cooperative throughout examination Eyes:  no conjunctivitis  Mouth:  no oral lesions  Lungs:  CTAB  Skin:  linear vesicular lesions w/ excoriation and crusting bilateral legs, right arm. no signs of inection.    Impression & Recommendations:  Problem # 1:  CONTACT DERMATITIS DUE TO POISON IVY (ICD-692.6) Assessment New  Will start topical steroid. Advised to not put bleach on any part of of the body. Advised to use Benadryl for itching as well. Advised to avoid burning poison ivy.  Her updated medication list for  this problem includes:    Clobetasol Propionate 0.05 % Oint (Clobetasol propionate) .Marland Kitchen... Apply to affected areas two times a day until improved. disp 60 gram tube  Orders: [facility]- Est Level  3 (99213)  Complete Medication List: 1)  Cvs Childrens Complete 60 Mg Chew (Pediatric multivit-minerals-c) .... 2 tabs by mouth once daily 2)  Iron 325 (65 Fe) Mg Tabs (Ferrous sulfate) .... 1/2 tab by mouth two times a day for anemia 3)  Clobetasol Propionate 0.05 % Oint (Clobetasol propionate) .... Apply to affected areas two times a day until improved. disp 60 gram tube  Patient Instructions: 1)  Apply clobetasol ointment to affected areas twice a day until improved.  2)  You can use Benadryl to help with itching as before. 3)  Follow up as needed.  Prescriptions: CLOBETASOL PROPIONATE 0.05 % OINT (CLOBETASOL PROPIONATE) Apply to affected areas two times a day until improved. Disp 60 gram tube  #1 x 3   Entered and Authorized by:   Bobby Rumpf  MD   Signed by:   Bobby Rumpf  MD on 06/14/2009   Method used:   [facility] to        [facility]  [id number]       [address]       [place], [place]  [address]       Ph: [phone] or [phone]  Fax: [phone]   RxID:   [id number]

## 2010-03-29 NOTE — Miscellaneous (Signed)
Summary: ETT Results?  Blue Team:  Was ETT ordered on 04/20/2009 done?  I don't see the results.  If not done, please call patient and ask if she is still having chest pain.  Please document her answer and forward to me.  If still having chest pain, please have her schedule a visit with me.  Thanks.  Romero Belling MD  August 03, 2009 2:31 PM  Left message on voicemail for patient to return call...............................................Marland KitchenGaren Grams LPN August 04, 5282 2:42 PM  Left another message for patient to call back...............................................Marland KitchenGaren Grams LPN August 09, 2009 9:07 AM  Left message will await call back from patient...............................................Marland KitchenGaren Grams LPN August 11, 2009 3:35 PM

## 2010-03-29 NOTE — Miscellaneous (Signed)
Summary: Chart Summary  Clinical Lists Changes  Problems: Assessed CHEST PAIN UNSPECIFIED as comment only - During chart review I see an ETT was ordered 04/20/2009, but I don't see the result in the chart.  Will forward to [facility] Team to see if they know if this was scheduled or not.       Impression & Recommendations:  Problem # 1:  CHEST PAIN UNSPECIFIED (ICD-786.50) Assessment Comment Only During chart review I see an ETT was ordered 04/20/2009, but I don't see the result in the chart.  Will forward to [facility] Team to see if they know if this was scheduled or not.  Complete Medication List: 1)  Cvs Childrens Complete 60 Mg Chew (Pediatric multivit-minerals-c) .... 2 tabs by mouth once daily 2)  Iron 325 (65 Fe) Mg Tabs (Ferrous sulfate) .... 1/2 tab by mouth two times a day for anemia 3)  Clobetasol Propionate 0.05 % Oint (Clobetasol propionate) .... Apply to affected areas two times a day until improved. disp 60 gram tube

## 2010-03-29 NOTE — Assessment & Plan Note (Signed)
Summary: OB VISIT/EO   Vital Signs:  Patient profile:   35 year old female Height:      66 inches Weight:      160 pounds Temp:     98.4 degrees F Pulse rate:   87 / minute BP sitting:   99 / 67  Vitals Entered By: Jone Baseman CMA (November 09, 2008 8:47 AM)  Habits & Providers  Alcohol-Tobacco-Diet     Cigarette Packs/Day: n/a  Physical Exam  General:  vital signs reviewed and normal Alert, appropriate; well-dressed and well-nourished  Lungs:  work of breathing unlabored, clear to auscultation bilaterally; no wheezes, rales, or ronchi; good air movement throughout  Heart:  regular rate and rhythm, no murmurs; normal s1/s2  Psych:  alert and oriented. full affect, normally interactive. Good eye contact.    Impression & Recommendations:  Problem # 1:  PREGNANCY (ICD-V22.2) Assessment Unchanged  30 D3U2025 (TAB x3), EDC 01/10/2009 (unk LMP, 12 wk U/S) O pos, Ab neg, RPR nr, HIV nr, HBsAg neg, Sickle neg Quad Screen Negative, but patient with 3 children with NTD so elevated risk.  Patient has declined further workup. Urinary Tract Infection, Enterobacter --> Rx with 1 gram Ceftriaxone IM x1 on 07/21/2008.  Cx neg (08/11/2008). Infant:  Female, name undecided, bottle feed Contraception:  None--wants another baby "unless this one hurts"  HIV, RPR negative last visit. Glucola (late because did not keep previous glucola appointment) today.  Advised 2 chewable vitamins and starting folic acid given previous history of NTDs. Labor precautions reviewed. follow-up in 2 weeks with Dr. Constance Goltz or Dr. Marliss Czar.  Orders: Medicaid OB visit - [facility] ([id number])  Complete Medication List: 1)  Cvs Childrens Complete 60 Mg Chew (Pediatric multivit-minerals-c) .... 2 tabs by mouth once daily  Patient Instructions: 1)  Go to the MAU or seek medical attention for contractions that become strong and fequent (every 5-10 minutes), vaginal bleeding, leakage or gushing of fluid, decreased movement  of your baby, or any other concerns. 2)  Continue taking flinstones vitamins (try to take 2 per day). Also try to take folic acid once a day. 3)  follow-up in 2 weeks with Dr. Marliss Czar or Dr. Constance Goltz. 4)  Very nice to meet you.    Flowsheet View for Follow-up Visit    Estimated weeks of       gestation:     31 1/7    Weight:     160    Blood pressure:   99 / 67    Headache:     No    Nausea/vomiting:   No    Edema:     0    Vaginal bleeding:   no    Vaginal discharge:   no    Fundal height:      30    FHR:       130s    Fetal activity:     yes    Labor symptoms:   few ctx    Taking prenatal vits?   Y    Smoking:     n/a    Next visit:     2 wk    Resident:     Rexene Alberts    Preceptor:     Mauricio Po

## 2010-03-29 NOTE — Assessment & Plan Note (Signed)
Summary: postpartum,tcb   Vital Signs:  Patient profile:   35 year old female Height:      66 inches Weight:      157.7 pounds BMI:     25.55 Temp:     98.0 degrees F oral Pulse rate:   76 / minute BP sitting:   111 / 75  (left arm) Cuff size:   regular  Vitals Entered By: Garen Grams LPN (February 10, 2009 10:26 AM) CC: postpartum check Is Patient Diabetic? No Pain Assessment Patient in pain? no        CC:  postpartum check.  History of Present Illness: 35 yo female presenting for 6 week postpartum check.  Delivered female infant, Byrd Hesselbach, via NSVD without complications on 01/05/2009.  No period, no sexual intercourse (partner is currently in [place]), has foul smelling discharge since delivery.  No abdominal pain.  Has been working since Thanksgiving.  Family in area helps take care of kids.  Is bottle feeding.  Mood = good, enjoys baby.  Desires Mirena IUD today because she wants 4 years between [place] and next baby.  No STDs in more than 10 years.  Habits & Providers  Alcohol-Tobacco-Diet     Tobacco Status: never  Current Medications (verified): 1)  Cvs Childrens Complete 60 Mg Chew (Pediatric Multivit-Minerals-C) .... 2 Tabs By Mouth Once Daily 2)  Iron 325 (65 Fe) Mg Tabs (Ferrous Sulfate) .... 1/2 Tab By Mouth Two Times A Day For Anemia  Allergies (verified): No Known Drug Allergies  Social History: Lives with 4 children.  Employed at [employer].  Tobacco 1/3 ppd.Smoking Status:  never  Physical Exam  Additional Exam:  VITALS:  Reviewed, normal GEN: Alert & oriented, no acute distress NECK: Midline trachea, no masses/thyromegaly, no cervical lymphadenopathy CARDIO: Regular rate and rhythm, no murmurs/rubs/gallops, 2+ bilateral radial pulses RESP: Clear to auscultation, normal work of breathing, no retractions/accessory muscle use ABD: Normoactive bowel sounds, nontender, no masses/hepatosplenomegaly EXT: Nontender, no edema GENITAL:  Normal introitus for  age, no external lesions, thick white vaginal discharge, mucosa pink and moist, no vaginal or cervical lesions, no vaginal atrophy, no friaility or hemorrhage, normal uterus size and position, no adnexal masses or tenderness  IUD Placement:  Betadine x3 swabs applied to cervix.  Tenaculum applied to anterior cervix.  Cervix sounded to 8 cm.  Mirena IUD placed in usual sterile fashion with manufacturer's insertion device.   Strings trimmed to 3 cm.  Patient tolerated procedure well.   Impression & Recommendations:  Problem # 1:  PREGNANCY (ICD-V22.2)  6 week postpartum, doing well, no depression.  Orders: Postpartum visit- [facility] [phone])  Problem # 2:  VAGINAL DISCHARGE (ICD-623.5) Assessment: New BV on wet prep -- metronidazole. Orders: GC/Chlamydia-[facility] ([id number]) [id number] visit- [facility] (59563)  Problem # 3:  CONTRACEPTIVE MANAGEMENT (ICD-V25.09) Assessment: Improved  Mirena IUD placed today.  Orders: Postpartum visit- [facility] [phone]) IUD insert- [facility] ([id number]) Ibuprofen 200mg  tab (EMRORAL)  Complete Medication List: 1)  Cvs Childrens Complete 60 Mg Chew (Pediatric multivit-minerals-c) .... 2 tabs by mouth once daily 2)  Iron 325 (65 Fe) Mg Tabs (Ferrous sulfate) .... 1/2 tab by mouth two times a day for anemia 3)  Metronidazole 500 Mg Tabs (Metronidazole) .Marland Kitchen.. 1 tab by mouth two times a day x7 days  Patient Instructions: 1)  Good to see you today. 2)  No sex for 1 month, until I do a string check on your IUD. 3)  Please schedule a follow-up appointment in  1 month for IUD string check. 4)  I've sent prescription for Metronidazole to your pharmacy for bacterial vaginosis (not sexually transmitted disease).  Don't drink alcohol while taking this. Prescriptions: METRONIDAZOLE 500 MG TABS (METRONIDAZOLE) 1 tab by mouth two times a day x7 days  #14 x 0   Entered and Authorized by:   Romero Belling MD   Signed by:   Romero Belling MD on 02/10/2009   Method used:    [facility]  [address] [id number]       [address]       [place], [place]  [address]       Ph: [phone] or [phone]       Fax: [phone]   RxID:   [id number]   Laboratory Results  Date/Time Received: February 10, 2009 11:38 AM  Date/Time Reported: February 10, 2009 11:45 AM   [employer] Source: vaginal WBC/hpf: 10-20 Bacteria/hpf: 3+  Cocci Clue cells/hpf: many  Positive whiff Yeast/hpf: none Trichomonas/hpf: none Comments: ...........test performed by...........Marland KitchenTerese Door, CMA February 10, 2009 11:45 AM      Medication Administration  Medication # 1:    Medication: Ibuprofen 200mg  tab    Diagnosis: CONTRACEPTIVE MANAGEMENT (ICD-V25.09)    Dose: 3 tablets    Route: po    Exp Date: 09/28/2010    Lot #: [id number]    Mfr: [employer]    Patient tolerated medication without complications    Given by: Garen Grams LPN (February 10, 2009 11:54 AM)  Orders Added: 1)  GC/Chlamydia-[facility] [[id number]] 2)  Mellody Drown Prep- [facility] [[id number]] 3)  Postpartum visit- [facility] [59563] 4)  IUD insert- [facility] [[id number]] 5)  Ibuprofen 200mg  tab [EMRORAL]  Appended Document: postpartum,tcb

## 2010-03-29 NOTE — Assessment & Plan Note (Signed)
Summary: OB VISIT/EO   Vital Signs:  Patient profile:   35 year old female Height:      66 inches Weight:      162 pounds BMI:     26.24 Pulse rate:   97 / minute BP sitting:   108 / 74  (left arm) Cuff size:   regular  Vitals Entered By: Molly Maduro Busick cma   Habits & Providers  Alcohol-Tobacco-Diet     Cigarette Packs/Day: n/a  Current Medications (verified): 1)  Cvs Childrens Complete 60 Mg Chew (Pediatric Multivit-Minerals-C) .... 2 Tabs By Mouth Once Daily  Allergies (verified): No Known Drug Allergies  Physical Exam  General:  Well-developed,well-nourished,in no acute distress; alert,appropriate and cooperative throughout examination Lungs:  Normal respiratory effort, chest expands symmetrically. Lungs are clear to auscultation, no crackles or wheezes. Heart:  Normal rate and regular rhythm. S1 and S2 normal without gallop, murmur, click, rub or other extra sounds. Abdomen:  gravid, measuring 33cm, nontender Extremities:  no swelling in lower legs   Impression & Recommendations:  Problem # 1:  PREGNANCY (ICD-V22.2) Assessment Unchanged  baby measuring 33cm, pt has gained 1.5 lbs.  pt wants IV pain meds during delivery.  She recieved her flu shot today.  Orders: Medicaid OB visit - [facility] ([id number])  Problem # 2:  UTI'S, RECURRENT (ICD-599.0) Assessment: Deteriorated  diagnosed at [facility], 100,000 e coli, sensitivites not back.  told pt to take the keflex and do kick counts if she was worried.  I will call her tomorrow with results of sensitivites.  will change her abx if possible.  Orders: Medicaid OB visit - [facility] ([id number])  Complete Medication List: 1)  Cvs Childrens Complete 60 Mg Chew (Pediatric multivit-minerals-c) .... 2 tabs by mouth once daily  Patient Instructions: 1)  please return in 1 week to see Dr. Corky Downs 2)  1)  Every morning and every evening evaluate whether you think the baby is moving enough.  If not, then do kick counts as follows.  Sit in a quiet  place and count each movement.  If you get to 5 baby movements in an hour you can stop.  If at 1 hour you have less than 5 movements, keep counting for another hour.  If you don't get 10 movements in these 2 hours, you should go to the MAU or call the clinic. 3)  2)  If you have vaginal bleeding, suspect your water has broken (large gush of fluid or continuous slow trickle from vagina) or you have more than 5 contractions per hour for 2 hours straight, please go directly to the MAU.  Prenatal Visit      This is a 35 years old female G7, P3, T3, PT0, LC3, Ab3 who is in for a prenatal visit.  Since the last visit, she notes that she is   Concerns noted: patient reports taht she had dysuria 2 days ago and went to [facility].  they diagnosed UTI and sent her home on keflex. She took one pill and says that the baby stopped moving so she would not take another.  she says that her urinary symptoms are gone.   Flowsheet View for Follow-up Visit    Estimated weeks of       gestation:     35 3/7    Weight:     162    Blood pressure:   108 / 74    Headache:     No    Nausea/vomiting:   No  Edema:     0    Vaginal bleeding:   no    Vaginal discharge:   no    Fundal height:      33    FHR:       150s    Fetal activity:     yes    Labor symptoms:   few ctx    Taking prenatal vits?   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 Date: 08/26/2009    Lot #: [id number]    VIS given: 09/20/06 version given December 09, 2008.  Flu Vaccine Consent Questions    Do you have a history of severe allergic reactions to this vaccine? no    Any prior history of allergic reactions to egg and/or gelatin? no    Do you have a sensitivity to the preservative Thimersol? no    Do you have a past history of Guillan-Barre Syndrome? no    Do you currently have an acute febrile illness? no    Have you ever had a severe reaction to latex? no    Vaccine information given and explained to patient? yes    Are you currently pregnant? yes

## 2010-03-29 NOTE — Miscellaneous (Signed)
Summary: Work note asap  Clinical Lists Changes Pt in office with son for wcc appt and ask for letter for job stating what she can and cannot do during pregnancy.  She states she has already been out of work 4 days and really needs letter stating her limitations and when she may return ..................Marland KitchenDelores Pate-Gaddy, CMA (AAMA) August 11, 2008 10:01 AM  letter in centricity.  Lequita Asal  MD  August 11, 2008 4:13 PM

## 2010-03-29 NOTE — Progress Notes (Signed)
Summary: triage  Phone Note Call from Patient Call back at [phone]   Caller: Patient Summary of Call: pt has a UTI and is now peeing blood - pls advise Initial call taken by: De Nurse,  October 08, 2009 3:12 PM  Follow-up for Phone Call        spoke with  patient and she devepoled pain with urination 2 days ago. now having blood in urine . advised given time of day now  she will need to go to urgent care to be evaluated. advised this should not wait until Monday. she voices understanding. Follow-up by: Theresia Lo RN,  October 08, 2009 3:30 PM

## 2010-03-29 NOTE — Assessment & Plan Note (Signed)
Summary: remove IUD,df   Vital Signs:  Patient profile:   35 year old female Height:      66 inches Weight:      163.7 pounds BMI:     26.52 Temp:     98.0 degrees F oral Pulse rate:   99 / minute BP sitting:   112 / 74  (right arm) Cuff size:   regular  Vitals Entered By: Garen Grams LPN (May 13, 2009 4:20 PM) CC: wants IUD removed Is Patient Diabetic? No Pain Assessment Patient in pain? no        Primary Care Provider:  Romero Belling MD  CC:  wants IUD removed.  History of Present Illness: 35 yo female presents to have IUD removed.  Attributes arm pain and chest pain to IUD and cannot be convinced otherwise.  Arm pain is in RIGHT elbow.  Has 4 mo infant who weighs 16 pounds which she agrees may be why her arms are tired and aching.  Does not desire pregnancy.  Does not want Depo or other forms of contraception.  States she will get her tubes tied soon.  Will practice abstinence in the meantime.  Current Medications (verified): 1)  Cvs Childrens Complete 60 Mg Chew (Pediatric Multivit-Minerals-C) .... 2 Tabs By Mouth Once Daily 2)  Iron 325 (65 Fe) Mg Tabs (Ferrous Sulfate) .... 1/2 Tab By Mouth Two Times A Day For Anemia  Allergies (verified): No Known Drug Allergies  Physical Exam  General:  Well-developed,well-nourished,in no acute distress; alert,appropriate and cooperative throughout examination Genitalia:  Pelvic Exam:        External: normal female genitalia without lesions or masses        Vagina: normal without lesions or masses        Cervix: normal without lesions or masses        Adnexa: normal bimanual exam without masses or fullness        Uterus: normal by palpation        Pap smear: not performed Additional Exam:  PROCEDURE NOTE:  IUD REMOVAL.  - Verbal consent obtained.  - Ring forceps applied to strings.  - IUD removed without incident.  - Patient tolerated well.   Impression & Recommendations:  Problem # 1:  IUD SURVEILLANCE  (ICD-V25.42) Assessment Deteriorated  IUD removed today.  Orders: IUD removal -[facility] ([address])  Complete Medication List: 1)  Cvs Childrens Complete 60 Mg Chew (Pediatric multivit-minerals-c) .... 2 tabs by mouth once daily 2)  Iron 325 (65 Fe) Mg Tabs (Ferrous sulfate) .... 1/2 tab by mouth two times a day for anemia

## 2010-03-29 NOTE — Miscellaneous (Signed)
Summary: ob ultrasound approved  Clinical Lists Changes medsolutions approved ob ultrasound #A [id number].Golden Circle RN  August 27, 2008 4:36 PM

## 2010-03-29 NOTE — Miscellaneous (Signed)
Summary: Procedure consent  Procedure consent   Imported By: Bradly Bienenstock 02/16/2009 14:17:54  _____________________________________________________________________  External Attachment:    Type:   Image     Comment:   External Document

## 2010-03-29 NOTE — Miscellaneous (Signed)
Summary: walk in  Clinical Lists Changes she was cleaning out the woods last week & got a rash that is spreading. itchy. nothing has helped it. states there was sumac in that area & might have been poison ivy.  unable to work at [employer] since the lesions are still weeping. NEEDS A NOTE. placed iin 8: 30 work in.Marland Kitchen Marland KitchenGolden Circle RN  June 14, 2009 9:13 AM

## 2010-03-29 NOTE — Letter (Signed)
Summary: GC/CT/Pap -- neg/neg/neg  [facility]  [address]   [place], [place] [address]   Phone: [phone]  Fax: [phone]    07/23/2008  [address] [place], [place]  [address]  Dear Ms. Tokarski,   The following are the results of your recent test(s):  Test     Result     Pap Smear    Normal____X___  Not Normal_____       Comments: Gonorrhea -- negative Chlamydia -- negative  Sincerely,  Romero Belling MD [facility]           Appended Document: GC/CT/Pap -- neg/neg/neg mailed

## 2010-03-29 NOTE — Assessment & Plan Note (Signed)
Summary: chest pain   Vital Signs:  Patient profile:   35 year old female Height:      66 inches Weight:      162 pounds BMI:     26.24 BSA:     1.83 Temp:     98.4 degrees F Pulse rate:   73 / minute BP sitting:   108 / 72  Vitals Entered By: Jone Baseman CMA (April 20, 2009 9:16 AM) CC: chest pain Is Patient Diabetic? No Pain Assessment Patient in pain? no        Primary Care Provider:  Romero Belling MD  CC:  chest pain.  History of Present Illness: 35 yo female with no significant cardiac history presenting with palpitations, dyspnea, and sharp substernal chest pain daily at work for the past 1-2 months since returning to work full time in January after delivering baby on 01/05/2009 via NSVD.  She works at [employer], which she describes as very active and busy.  The symptoms occur with activity at work and last several hours until she leaves work.  ROS:  Negative for cough, wheeze, LE edema, orthopnea, PND.  Habits & Providers  Alcohol-Tobacco-Diet     Tobacco Status: quit > 6 months  Allergies: No Known Drug Allergies  Family History: Possible early coronary artery disease--patient uncertain of details.  Social History: Lives with 4 children.  Employed at [employer].  Smoked from age 53 to age 77, approx 1 ppd.Smoking Status:  quit > 6 months  Physical Exam  Additional Exam:  VITALS:  Reviewed, normal GEN: Alert & oriented, no acute distress NECK: Midline trachea, no masses/thyromegaly, no cervical lymphadenopathy CARDIO: Regular rate and rhythm, no murmurs/rubs/gallops, 2+ bilateral radial pulses RESP: Clear to auscultation, normal work of breathing, no retractions/accessory muscle use ABD: Normoactive bowel sounds, nontender, no masses/hepatosplenomegaly EXT: Nontender, no edema  EKG:  NSR @ 67 bpm, normal intervals, normal exis, normal QRS morphology, no ST or T changes   Impression & Recommendations:  Problem # 1:  CHEST PAIN  UNSPECIFIED (ICD-786.50) Atypical angina in 35 yo female with good exercise ability.  Will schedule treadmill. EKG reviewed and is normal.  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 DDx also includes:  asthma, GERD, MSK pain. Orders: EKG- [facility] (EKG) [facility]- Est  Level 4 (99214) B Nat Peptide-[facility] ([id number]) ETT (ETT)  Problem # 2:  IUD SURVEILLANCE (ICD-V25.42) Assessment: Comment Only Declines string check today.  Would like done in May when she has her Pap.  Complete Medication List: 1)  Cvs Childrens Complete 60 Mg Chew (Pediatric multivit-minerals-c) .... 2 tabs by mouth once daily 2)  Iron 325 (65 Fe) Mg Tabs (Ferrous sulfate) .... 1/2 tab by mouth two times a day for anemia  Patient Instructions: 1)  Please schedule a follow-up appointment in June for repeat Pap and IUD string check.  Condoms until IUD confirmed. 2)  We are setting up a treadmill stress test for you.

## 2010-03-29 NOTE — Letter (Signed)
Summary: work note  [facility]  [address]   [place], [place] [address]   Phone: [phone]  Fax: [phone]    August 11, 2008   Employee:  [name]    To Whom It May Concern:   [name] is unable to do any significant heavy lifting (>10 lbs). Otherwise, she is medically cleared to continue working.          Sincerely,    Lequita Asal  MD  Appended Document: work note Pt notified via phone letter ready for pickup at front desk ..................Marland KitchenDelores Pate-Gaddy, CMA (AAMA) {DATETIMESTAMP()}

## 2010-03-29 NOTE — Letter (Signed)
Summary: Return to work  [facility]  [address]   [place], [place] [address]   Phone: [phone]  Fax: [phone]    01/11/2009  [name] [address] [place], [place]  [address]  Dear [name],  TO WHOM IT MAY CONCERN:  Please allow [name] (dob 07/28/1975) to return to work.  She needs to limit her lifting to no more than 10 pounds at a time.  Sincerely,   Romero Belling MD

## 2010-03-29 NOTE — Assessment & Plan Note (Signed)
Summary: OB 33.2   Flowsheet View for Follow-up Visit    Estimated weeks of       gestation:     33 2/7    Weight:     161.4    Blood pressure:   95 / 65    Hx headache?     No    Nausea/vomiting?   No    Edema?     0    Bleeding?     no    Leakage/discharge?   no    Fetal activity:       yes    Labor symptoms?   few ctx    Fundal height:      33    FHR:       140s    Taking Vitamins?   Y    Smoking PPD:   n/a    Next visit:     2 wk    Resident:     mdo    Preceptor:     Mauricio Po  Physical Examination  Vital Signs:  P: 90  BP (upright): 95/65  Wt: 161.4  Last Ht: 66 (11/09/2008)  Impression & Recommendations:  Problem # 1:  PREGNANCY (ICD-V22.2) Assessment Unchanged 30 [id number] (TAB x3), EDC 01/10/2009 (unk LMP, 12 wk U/S) O pos, Ab neg, RPR nr, HIV nr, HBsAg neg, Sickle neg, 1h GCT 105. Quad Screen Negative, but patient with 3 children with NTD so elevated risk.  Patient has declined further workup. Urinary Tract Infection, Enterobacter --> Rx with 1 gram Ceftriaxone IM x1 on 07/21/2008.  Cx neg (08/11/2008). Infant:  Female, name undecided, bottle feed Contraception:  None--wants another baby "unless this one hurts"  Orders: Medicaid OB visit - [facility] ([id number])  Complete Medication List: 1)  Cvs Childrens Complete 60 Mg Chew (Pediatric multivit-minerals-c) .... 2 tabs by mouth once daily  Patient Instructions: 1)  Every morning and every evening evaluate whether you think the baby is moving enough.  If not, then do kick counts as follows.  Sit in a quiet place and count each movement.  If you get to 5 baby movements in an hour you can stop.  If at 1 hour you have less than 5 movements, keep counting for another hour.  If you don't get 10 movements in these 2 hours, you should go to the MAU or call the clinic. 2)  If you have vaginal bleeding, suspect your water has broken (large gush of fluid or continuous slow trickle from vagina) or you have more than 5 contractions per  hour for 2 hours straight, please go directly to the MAU. 3)  Please schedule a follow-up appointment in 2 weeks with Dr. Marliss Czar - Speigel.  Flowsheet View for Follow-up Visit    Estimated weeks of       gestation:     33 2/7    Weight:     161.4    Blood pressure:   95 / 65    Headache:     No    Nausea/vomiting:   No    Edema:     0    Vaginal bleeding:   no    Vaginal discharge:   no    Fundal height:      33    FHR:       140s    Fetal activity:     yes    Labor symptoms:   few ctx    Taking prenatal vits?  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

## 2010-03-29 NOTE — Miscellaneous (Signed)
Summary: RECORDS FAXED TO MAU  Clinical Lists Changes 

## 2010-03-29 NOTE — Progress Notes (Signed)
Summary: Referral  Phone Note Call from Patient Call back at [phone]   Reason for Call: Talk to Nurse Summary of Call: [facility], she still doesn't have her medicaid card. wants [place] to reschedule Initial call taken by: Knox Royalty,  August 09, 2009 2:14 PM  Follow-up for Phone Call        Left message on vm with number to [facility] clinics for her to reschedule this herself. Follow-up by: Garen Grams LPN,  August 09, 2009 2:52 PM     Appended Document: Referral she did not keep appt at [facility] 08/12/09

## 2010-03-29 NOTE — Assessment & Plan Note (Signed)
Summary: OB 21 4/7   Flowsheet View for Follow-up Visit    Estimated weeks of       gestation:     21 4/7    Weight:     153    Blood pressure:   93 / 65    Hx headache?     daily    Nausea/vomiting?   No    Edema?     0    Bleeding?     no    Leakage/discharge?   no    Fetal activity:       yes    Labor symptoms?   no    Fundal height:      21    FHR:       140s    Fetal position:      N/A    Taking Vitamins?   Y    Smoking PPD:   n/a    Next visit:     4 wk    Resident:     mdo    Preceptor:     breen  Physical Examination  Vital Signs:  BP (upright): 93/65  Wt: 153  Last Ht: 66 (07/21/2008)  Impression & Recommendations:  Problem # 1:  PREGNANCY (ICD-V22.2) Assessment Unchanged  30 [id number], EDC 01/10/2009 (unk LMP, 12 wk U/S) O pos, Ab neg, RPR nr, HIV nr, HBsAg neg, Sickle neg Urinary Tract Infection, Enterobacter --> Rx with 1 gram Ceftriaxone IM x1 on 07/21/2008 07/07: Headache almost daily when takes prenatal vitamin--will try childrens vitamin 2 by mouth daily.  Orders: Medicaid OB visit - [facility] ([id number])  Problem # 2:  UTI'S, RECURRENT (ICD-599.0) Assessment: Unchanged No symptoms today.  Treated earlier in pregnancy for UTI with CTX. Test of cure: negative.  Complete Medication List: 1)  Cvs Childrens Complete 60 Mg Chew (Pediatric multivit-minerals-c) .... 2 tabs by mouth once daily  Patient Instructions: 1)  If you have vaginal bleeding, suspect your water has broken (large gush of fluid or continuous slow trickle from vagina) or you have more than 5 contractions per hour for 2 hours straight, please go directly to the MAU. 2)  Every morning and every evening evaluate whether you think the baby is moving enough.  If not, then do kick counts as follows.  Sit in a quiet place and count each movement.  If you get to 5 baby movements in an hour you can stop.  If at 1 hour you have less than 5 movements, keep counting for another hour.  If you don't get 10  movements in these 2 hours, you should go to the MAU or call the clinic. 3)  Please schedule a follow-up appointment in 4 weeks.  Flowsheet View for Follow-up Visit    Estimated weeks of       gestation:     21 4/7    Weight:     153    Blood pressure:   93 / 65    Headache:     daily    Nausea/vomiting:   No    Edema:     0    Vaginal bleeding:   no    Vaginal discharge:   no    Fundal height:      21    FHR:       140s    Fetal activity:     yes    Labor symptoms:   no    Fetal position:  N/A    Taking prenatal vits?   Y    Smoking:     n/a    Next visit:     4 wk    Resident:     mdo    Preceptor:     breen  OB Ultrasound Data Entry   Ultrasound Date:     09/02/2008   Fetal number:       1              Cardiac motion:     Yes          [facility]:    01/10/2009

## 2010-04-10 ENCOUNTER — Encounter: Payer: Self-pay | Admitting: *Deleted
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
## 2010-05-13 LAB — POCT PREGNANCY, URINE: Preg Test, Ur: NEGATIVE

## 2010-05-13 LAB — URINALYSIS, ROUTINE W REFLEX MICROSCOPIC
Glucose, UA: NEGATIVE mg/dL
Ketones, ur: 15 mg/dL — AB
Nitrite: POSITIVE — AB
Protein, ur: 30 mg/dL — AB
Specific Gravity, Urine: 1.023 (ref 1.005–1.030)
Urobilinogen, UA: 2 mg/dL — ABNORMAL HIGH (ref 0.0–1.0)
pH: 5.5 (ref 5.0–8.0)

## 2010-05-13 LAB — URINE MICROSCOPIC-ADD ON

## 2010-05-13 LAB — URINE CULTURE
Colony Count: >=100000
Culture  Setup Time: 201108131321

## 2010-05-16 LAB — RAPID STREP SCREEN (MED CTR MEBANE ONLY): Streptococcus, Group A Screen (Direct): NEGATIVE

## 2010-06-01 LAB — CBC
HCT: 33.6 % — ABNORMAL LOW (ref 36.0–46.0)
Hemoglobin: 11.2 g/dL — ABNORMAL LOW (ref 12.0–15.0)
MCHC: 33.4 g/dL (ref 30.0–36.0)
MCV: 87.2 fL (ref 78.0–100.0)
Platelets: 260 10*3/uL (ref 150–400)
RBC: 3.85 MIL/uL — ABNORMAL LOW (ref 3.87–5.11)
RDW: 13.8 % (ref 11.5–15.5)
WBC: 9.4 10*3/uL (ref 4.0–10.5)

## 2010-06-01 LAB — RPR: RPR Ser Ql: NONREACTIVE

## 2010-06-02 LAB — URINE MICROSCOPIC-ADD ON

## 2010-06-02 LAB — URINE CULTURE: Colony Count: >=100000

## 2010-06-02 LAB — URINALYSIS, ROUTINE W REFLEX MICROSCOPIC
Bilirubin Urine: NEGATIVE
Glucose, UA: NEGATIVE mg/dL
Ketones, ur: NEGATIVE mg/dL
Nitrite: NEGATIVE
Protein, ur: 100 mg/dL — AB
Specific Gravity, Urine: >1.03 — ABNORMAL HIGH (ref 1.005–1.030)
Urobilinogen, UA: 1 mg/dL (ref 0.0–1.0)
pH: 6 (ref 5.0–8.0)

## 2010-06-03 LAB — GLUCOSE, CAPILLARY: Glucose-Capillary: 105 mg/dL — ABNORMAL HIGH (ref 70–99)

## 2010-06-07 LAB — CBC
HCT: 34.2 % — ABNORMAL LOW (ref 36.0–46.0)
Hemoglobin: 11.9 g/dL — ABNORMAL LOW (ref 12.0–15.0)
MCHC: 34.9 g/dL (ref 30.0–36.0)
MCV: 90.2 fL (ref 78.0–100.0)
Platelets: 247 10*3/uL (ref 150–400)
RBC: 3.79 MIL/uL — ABNORMAL LOW (ref 3.87–5.11)
RDW: 13.7 % (ref 11.5–15.5)
WBC: 7.1 10*3/uL (ref 4.0–10.5)

## 2010-06-07 LAB — DIFFERENTIAL
Basophils Absolute: 0 10*3/uL (ref 0.0–0.1)
Basophils Relative: 0 % (ref 0–1)
Eosinophils Absolute: 0.2 10*3/uL (ref 0.0–0.7)
Eosinophils Relative: 2 % (ref 0–5)
Lymphocytes Relative: 27 % (ref 12–46)
Lymphs Abs: 1.9 10*3/uL (ref 0.7–4.0)
Monocytes Absolute: 0.6 10*3/uL (ref 0.1–1.0)
Monocytes Relative: 8 % (ref 3–12)
Neutro Abs: 4.4 10*3/uL (ref 1.7–7.7)
Neutrophils Relative %: 62 % (ref 43–77)

## 2010-06-07 LAB — GC/CHLAMYDIA PROBE AMP, GENITAL
Chlamydia, DNA Probe: NEGATIVE
GC Probe Amp, Genital: NEGATIVE

## 2010-06-07 LAB — URINALYSIS, MICROSCOPIC ONLY
Bilirubin Urine: NEGATIVE
Glucose, UA: NEGATIVE mg/dL
Hgb urine dipstick: NEGATIVE
Ketones, ur: NEGATIVE mg/dL
Leukocytes, UA: NEGATIVE
Nitrite: NEGATIVE
Protein, ur: NEGATIVE mg/dL
Specific Gravity, Urine: 1.015 (ref 1.005–1.030)
Urobilinogen, UA: 0.2 mg/dL (ref 0.0–1.0)
pH: 6 (ref 5.0–8.0)

## 2010-06-07 LAB — WET PREP, GENITAL
Trich, Wet Prep: NONE SEEN
Yeast Wet Prep HPF POC: NONE SEEN

## 2010-06-10 ENCOUNTER — Other Ambulatory Visit: Payer: Self-pay | Admitting: *Deleted

## 2010-06-10 DIAGNOSIS — F419 Anxiety disorder, unspecified: Secondary | ICD-10-CM

## 2010-06-10 MED ORDER — SERTRALINE HCL 25 MG PO TABS: 25.0000 mg | ORAL_TABLET | Freq: Every day | ORAL | Status: DC

## 2010-06-10 NOTE — Telephone Encounter (Signed)
Consulted with Dr. Deirdre Priest and advised that we can refill for one month and   patient will need appointment.  She was suppose to have returned in 1-2 weeks after appointment 03/28/2010. Spoke with patient and she states she had not requested med from  pharmacy because she has no insurance now and will have to pay out of pocket.Marland Kitchen  She wants RN to go ahead and send in RX however and she will decide if she will refill. Advised to schedule appointment before next refill is needed.

## 2010-07-15 NOTE — Discharge Summary (Signed)
   NAME:  [name], [name]                          ACCOUNT NO.:  [id number]   MEDICAL RECORD NO.:  [id number]                   PATIENT TYPE:  INP   LOCATION:  9303                                 [facility]ILITY[facility]   PHYSICIAN:  Conni Elliot, M.D.             DATE OF BIRTH:  1976/02/08   DATE OF ADMISSION:  01/16/2002  DATE OF DISCHARGE:  01/18/2002                                 DISCHARGE SUMMARY   DISCHARGE DIAGNOSES:  1. Intrauterine pregnancy at 22 weeks.  2. Pyelonephritis.   DISCHARGE MEDICATIONS:  1. Septra DS p.o. b.i.d.  2. Prenatal vitamins p.o. daily.   FOLLOWUP:  Is to follow up with Dr. Remer Macho at [facility].   HISTORY OF PRESENT ILLNESS:  Please see full H&P for details.  In brief, 35-  year-old G4, P2-0-1-2 presented at 22 weeks 5 days with three day history of  back pain.  She also reported subjective fevers and anorexia for one day.   PAST MEDICAL HISTORY:  Frequent urinary tract infections in 2003.  She had a  urine culture on January 12, 2002 which grew greater than 100,000 of coag  negative Staphylococcus aureus.   LABORATORY DATA:  WBC 10.2, hemoglobin 12.1, platelets 278,000.   PHYSICAL EXAMINATION:  ABDOMEN:  Bilateral CVA tenderness.   PROCEDURES:  On January 17, 2002, a renal ultrasound revealed mild right  pyelectasis and caliectasis.  No evidence for a solid mass or calculi.   HOSPITAL COURSE:  Problem #1.  Intrauterine pregnancy:  Was monitored with  fetal Dopplers and remained stable throughout hospitalization.   Problem #2.  Pyelonephritis:  The patient received IV fluids and was started  on treatment with IV gentamicin.  Her clinical status rapidly improved and  by day of discharge she denied any abdominal pain, dysuria, or back pain.  She was ambulating and tolerating p.o. well.  She was started on Septra DS  at discharge to complete a ten day course.  Her primary physician planned to  start suppressive  treatment with nitrofurantoin at followup.   DISCHARGE INSTRUCTIONS:  She was discharged to home in stable condition with  preterm labor precautions.     [name], M.D.                      Conni Elliot, M.D.    Nadara Eaton  D:  07/03/2002  T:  07/03/2002  Job:  [id number]

## 2010-07-15 NOTE — Group Therapy Note (Signed)
[name], [name]                ACCOUNT NO.:  [id number]   MEDICAL RECORD NO.:  [id number]          PATIENT TYPE:  WOC   LOCATION:  [facility]                   FACILITY:  [facility]   PHYSICIAN:  Jon Gills, MD    DATE OF BIRTH:  10/26/1975   DATE OF SERVICE:  04/19/2004                                    CLINIC NOTE   CHIEF COMPLAINT:  Followup CIN-3.   SUBJECTIVE:  Ms. [name] is here today for followup.  She states her last  menstrual period was on 04/08/2004.  She is not on any birth control method.  Her last Pap smear was in August which showed low-grade SIL.  Her LEEP,  which was done in May of 2005, showed CIN 3 with involvement of the  ectocervical margin on one portion of the resection.  Currently, she is very  adamant about wanting a hysterectomy because she states she is going to get  pregnant.  She denies any other complaints.   OBJECTIVE:  VITAL SIGNS:  Noted.  GENERAL:  She is a thin-appearing white female in no acute distress.  PELVIC:  Reveals normal external female genitalia.  Normal urethra and  vaginal walls.  Cervix appears shortened with no masses or lesions.  Urethra  and urethral meatus are within normal limits.   ASSESSMENT AND PLAN:  1.  Cervical intraepithelial neoplasia, grade 3.  A Pap smear has been      repeated today.  As far as the issue of hysterectomy, both Dr. Okey Dupre and      myself explained to the patient that she cannot have a hysterectomy for      CIN 1, but if her CIN 3 recurred she would be eligible for one.  2.  Contraception.  The patient was advised of all the options of birth      control.  We did recommend tubal ligation since she has had 3 children      with 2 abortions.  The patient is worried that she is going to get      pregnant with her tubes being tied, but is currently not using any birth      control.  For now, we have decided that she will be on the NuvaRing, and      I have written her for a one-year supply.  If before her  61-month followup she desires bilateral tubal ligation, we can set her up for  a preoperative appointment.  1.  We will call her or send her a card with her Pap smear results, and she      can follow up in 6 months for a repeat Pap.      LC/MEDQ  D:  04/19/2004  T:  04/19/2004  Job:  [id number]

## 2010-07-15 NOTE — Group Therapy Note (Signed)
[name], [name]                ACCOUNT NO.:  [id number]   MEDICAL RECORD NO.:  [id number]          PATIENT TYPE:  WOC   LOCATION:  [facility]                   [facility]   PHYSICIAN:  Elsie Lincoln, MD      DATE OF BIRTH:  02-May-1975   DATE OF SERVICE:  10/04/2004                                    CLINIC NOTE   ADDENDUM TO DICTATION [phone]  The patient also was given a prescription for Flagyl 500 mg four tablets x1  for presumptive Trichomonas.       KL/MEDQ  D:  10/04/2004  T:  10/04/2004  Job:  [id number]

## 2010-07-15 NOTE — Group Therapy Note (Signed)
NAME:  [name], [name]                          ACCOUNT NO.:  [id number]   MEDICAL RECORD NO.:  [id number]                   PATIENT TYPE:  OUT   LOCATION:  [facility]                           FACILITY:  [facility]   PHYSICIAN:  Argentina Donovan, MD                     DATE OF BIRTH:  Jul 14, 1975   DATE OF SERVICE:  05/26/2003                                    CLINIC NOTE   REASON FOR VISIT:  The patient is a 35 year old gravida 5 para 3-0-2-3 with  two therapeutic abortions who has been seen in the family practice clinic  and has had CIN-3 severe dysplasia on recent colposcopy.  She was evaluated  in the clinic today and has a very wide lesion of ectropion on the cervix  which I think, because of the risk of bleeding, would be better off done in  the operating room so we are going to have her admitted for LEEP conization  of the cervix.  She has watched the film on LEEP conization.  The patient  has no significant medical history.  She has no allergies, no serious  illnesses with the exception of recurrent urinary tract infections.  She is  on Ortho Tri-Cyclen Lo for birth control.  She had been on Ortho Tri-Cyclen  regular before and has been very unhappy because of intermenstrual bleeding  with the lower one.  We are going to increase her dosage to the previous  prescription that she was taking.  The patient is 5 feet 6 inches, weighs  143 pounds, has a blood pressure 110/69, pulse of 72, temperature is 98.4  today, and the patient is a smoker.  We have discussed with her the  possibility of stopping her smoking but she does not seem too enthusiastic  about the prospect.   IMPRESSION:  Cervical intraepithelial neoplasia grade 3, severe cervical  dysplasia.  To be scheduled for a LEEP conization.                                               Argentina Donovan, MD    PR/MEDQ  D:  05/26/2003  T:  05/26/2003  Job:  [id number]

## 2010-07-15 NOTE — Group Therapy Note (Signed)
[name], [name]                ACCOUNT NO.:  [id number]   MEDICAL RECORD NO.:  [id number]          PATIENT TYPE:  WOC   LOCATION:  [facility]                   [facility]   PHYSICIAN:  Elsie Lincoln, MD      DATE OF BIRTH:  1975-06-23   DATE OF SERVICE:                                    CLINIC NOTE   Patient is a 35 year old female who is here for follow-up Pap smear, LEEP.  The patient had a LEEP done 06/29/2003 with severe squamous dysplasia CIM-  III with focal involvement of the ectocervical margin of resection.  Pap  smear was low grade in August 2005 and again ascus in February 2006 with HPV  not detected and the patient is also complaining of lower abdominal pain,  milky white discharge, itching and faint odor.  The patient said it was much  worse prior to her period, however now she is just having slight milky  discharge but no odor.  She does not have any pain with urination or  defecation but has slight pain with sex.  It is not severe.  Her LMP was  September 28, 2004.  Pregnancy is doubtful.  Patient uses condoms for birth  control and refuses anything else.   PHYSICAL EXAMINATION:  ABDOMEN:  Soft.  Nontender.  Nondistended.  No  rebound, no guarding.  GENITALIA:  Tanner 5.  VAGINA:  Whitish, green discharge from os.  CERVIX:  Slight strawberry appearance.  UTERUS:  Mobile, nontender.  Slight cervical motion tenderness.  More pain  on the right than the left, however not impressive.  No masses felt.  Both  ovaries palpated.   Wet prep, questionable several Trich on office wet prep, however the power  on the microscope is not strong enough for me to delineate a flagella.   ASSESSMENT/PLAN:  A 35 year old female for cervical dysphagia for repeat Pap  and milky white discharge, most likely bacterial vaginitis or Trichomonas.  1.  Pap smear done.  2.  GC, chlamydia, wet prep done.  Office findings stated above.  3.  Patient has some left Achilles heel pain and was  urged to go to her      primary care physician or urgent care.  4.  Return to clinic in 4 months for Pap smear.       KL/MEDQ  D:  10/04/2004  T:  10/04/2004  Job:  [id number]
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
## 2010-07-15 NOTE — Op Note (Signed)
NAME:  [name], [name]                          ACCOUNT NO.:  [id number]   MEDICAL RECORD NO.:  [id number]                   PATIENT TYPE:  AMB   LOCATION:  SDC                                  FACILITY:  [facility]   PHYSICIAN:  Phil D. Okey Dupre, M.D.                  DATE OF BIRTH:  Feb 14, 1976   DATE OF PROCEDURE:  06/29/2003  DATE OF DISCHARGE:                                 OPERATIVE REPORT   PROCEDURE:  LEEP conization of the cervix.   PREOPERATIVE DIAGNOSIS:  Severe cervical dysplasia, CIN-3 with large  ectropion.   POSTOPERATIVE DIAGNOSIS:  Severe cervical dysplasia, CIN-3 with large  ectropion (pending pathology report).   DESCRIPTION OF PROCEDURE:  Under satisfactory general anesthesia with the  patient in dorsal lithotomy position, the insulated speculum with lateral  protection was placed into the vagina, so that the cervix was in the center  of the operative site.  Figure-of-eights of 0 chromic were placed in each of  the lateral portions of the cervix, to maintain hemostasis because of a  large section to be removed.  The transition zone was easily visualized and  the limits of the ectropion also.  The area was treated with 3% acetic acid.  Using a 66 W blended cutting current, a  2.0 x 1.5 cm loop LEEP conization specimen was taken.  We missed a small  area to the far right of the lesion, which was removed by the second pass.  A ball cautery was used around all the affected area, and to control the  active bleeding within the operative site.  This was then treated with  Moncell solution with some pressure for five minutes.  At the end there  seemed to be no active bleeding.   The speculum was removed and the patient transferred to the recovery room in  satisfactory condition.   ESTIMATED BLOOD LOSS:  Approximately 30 cc.   DISPOSITION:  I should add that prior to the LEEP procedure, there was a  four-quadrant injection of 1% Xylocaine with 1:100,000 epinephrine at 2, 10,  4  and 8 o'clock, using approximately 2 cc in each area to reduce bleeding.  The  LEEP specimen was sent for pathological diagnosis, with a gold pin at  12 o'clock and a blue pin at 6 o'clock.                                               Phil D. Okey Dupre, M.D.    PDR/MEDQ  D:  06/29/2003  T:  06/30/2003  Job:  [id number]

## 2010-07-15 NOTE — Group Therapy Note (Signed)
[name], [name]                ACCOUNT NO.:  [id number]   MEDICAL RECORD NO.:  [id number]          PATIENT TYPE:  WOC   LOCATION:  [facility]                   FACILITY:  [facility]   PHYSICIAN:  Argentina Donovan, MD        DATE OF BIRTH:  [birth year]   DATE OF SERVICE:                                    CLINIC NOTE   Patient is a 35 year old gravida 5, para 3-0-2-3 with all vaginal deliveries  who had a CIN III severe dysplasia colposcopy and underwent LEEP conization  in May which showed severe squamous metaplasia CIN III focal involvement in  the exocervical margin of resection.  She came in and had a repeat Pap smear  on October 23, 2003 which was low grade squamous intraepithelial lesion CIN I  and comes in today desiring a tubal ligation.  We have discussed this with  the patient stating that if she has a continuous abnormal Pap smear for the  next Pap smear she probably is better off having a vaginal hysterectomy  since she has already had three vaginal deliveries.  She understands this  and does not want to go through two operations so we are going to use the  Ortho-Evra patch until February when she comes in for her Pap smear and  depending on the finding on that we will either do a tubal ligation or  vaginal hysterectomy.      PR/MEDQ  D:  01/05/2004  T:  01/06/2004  Job:  [id number]

## 2010-09-03 ENCOUNTER — Other Ambulatory Visit: Payer: Self-pay | Admitting: Family Medicine

## 2010-09-04 NOTE — Telephone Encounter (Signed)
Refill request

## 2010-09-05 NOTE — Telephone Encounter (Signed)
Tried to call the patient on home and cell # and was unable to reach her or leave a message. I also called the pharmacy and spoke to the pharmacist who has the same #. I will not refill the medication at this time. I need to speak with the pt regarding symptoms. Pt failed to f/u after the medication was initiated.

## 2010-10-13 ENCOUNTER — Other Ambulatory Visit (HOSPITAL_COMMUNITY)
Admission: RE | Admit: 2010-10-13 | Discharge: 2010-10-13 | Disposition: A | Discharge status: Home / Self Care | Payer: Self-pay | Source: Ambulatory Visit | Attending: Family Medicine | Admitting: Family Medicine

## 2010-10-13 ENCOUNTER — Ambulatory Visit (INDEPENDENT_AMBULATORY_CARE_PROVIDER_SITE_OTHER): Payer: Self-pay | Admitting: Family Medicine

## 2010-10-13 DIAGNOSIS — L309 Dermatitis, unspecified: Secondary | ICD-10-CM

## 2010-10-13 DIAGNOSIS — Z01419 Encounter for gynecological examination (general) (routine) without abnormal findings: Secondary | ICD-10-CM | POA: Insufficient documentation

## 2010-10-13 DIAGNOSIS — L259 Unspecified contact dermatitis, unspecified cause: Secondary | ICD-10-CM

## 2010-10-13 DIAGNOSIS — N760 Acute vaginitis: Secondary | ICD-10-CM

## 2010-10-13 DIAGNOSIS — F411 Generalized anxiety disorder: Secondary | ICD-10-CM

## 2010-10-13 DIAGNOSIS — F419 Anxiety disorder, unspecified: Secondary | ICD-10-CM

## 2010-10-13 DIAGNOSIS — Z124 Encounter for screening for malignant neoplasm of cervix: Secondary | ICD-10-CM

## 2010-10-13 MED ORDER — METRONIDAZOLE 500 MG PO TABS: 500.0000 mg | ORAL_TABLET | Freq: Two times a day (BID) | ORAL | Status: AC

## 2010-10-13 MED ORDER — PREDNISONE 20 MG PO TABS: 60.0000 mg | ORAL_TABLET | Freq: Every day | ORAL | Status: AC

## 2010-10-13 MED ORDER — SERTRALINE HCL 25 MG PO TABS: 25.0000 mg | ORAL_TABLET | Freq: Every day | ORAL | Status: DC

## 2010-10-13 MED ORDER — FERROUS SULFATE 325 (65 FE) MG PO TBEC: DELAYED_RELEASE_TABLET | ORAL | Status: DC

## 2010-10-13 NOTE — Assessment & Plan Note (Signed)
Imp: poison ivy dermatitis Plan: prednisone x 14 days.

## 2010-10-13 NOTE — Assessment & Plan Note (Signed)
Wet prep done in office consistent with BV. Mild CMT concerning for cervicitis, but there was no discrete discharge from the cervix. Plan to f/u pap smear.

## 2010-10-13 NOTE — Patient Instructions (Signed)
[name],   Thank you for coming back to see me.  I have sent a script to your pharmacy for flagyl, prednisone and zoloft. I will call you with your pap smear results.  Please f/u with me in 1 mos to discuss anxiety.  -Dr. Armen Pickup

## 2010-10-13 NOTE — Assessment & Plan Note (Addendum)
Imp: GAD, with worsening situational anxiety. Plan: Restart Zoloft, f/u in 1 mos.

## 2010-10-13 NOTE — Progress Notes (Signed)
  Subjective:    Patient ID: [name], female    DOB: 1976/01/31, 35 y.o.   MRN: [id number]  HPI Pt here to discuss the following:  1. Vaginal pain: persistent pain x 1 week. Pain worse with sex. LMP 8/1-8/5. No vaginal bleeding outside of LMP. Slight increase in baseline vaginal discharge. No vaginal itching or lesions.  2. Poison Ivy: Pt with pruritic lesions x 1 mos. Lesions on arms, legs and buttocks. Pt with poison ivy around her home. She has used her clobetasol and benadryl w/o relief of itching. There has been some resolution in the rash.  3. Anxiety: worsening anxiety. Pt ran out of zoloft. Anxiety increased by recent social upheaval. Terri Skains (her son) was improperly disciplined by her long term boyfriend. CPS is involved, the family is moving from their home. She and her long term boyfriend are planning to separate.  She is still sleeping. She is having some bad dreams at night. She is still functioning well throughout the day.   Reviewed and updated pertinent social history.    Review of Systems As per HPI    Objective:   Physical Exam  Nursing note and vitals reviewed. Genitourinary: Uterus normal. Pelvic exam was performed with patient prone. Cervix exhibits motion tenderness. Right adnexum displays no mass, no tenderness and no fullness. Left adnexum displays no mass, no tenderness and no fullness. No erythema around the vagina. No signs of injury around the vagina. Vaginal discharge found.       Discharge moderate consistency and white.   Skin: Rash noted.       Erythematous localized rash on L buttock, R leg and R arm.    Wet prep: mild positive whiff, sperm, no trichomonas, no yeast on KOH, clue cells.      Assessment & Plan:

## 2010-10-18 ENCOUNTER — Encounter: Payer: Self-pay | Admitting: Family Medicine

## 2010-11-30 ENCOUNTER — Emergency Department (HOSPITAL_COMMUNITY)
Admission: EM | Admit: 2010-11-30 | Discharge: 2010-11-30 | Disposition: A | Discharge status: Home / Self Care | Payer: No Typology Code available for payment source | Attending: Emergency Medicine | Admitting: Emergency Medicine

## 2010-11-30 ENCOUNTER — Emergency Department (HOSPITAL_COMMUNITY): Payer: Self-pay

## 2010-11-30 DIAGNOSIS — S335XXA Sprain of ligaments of lumbar spine, initial encounter: Secondary | ICD-10-CM | POA: Insufficient documentation

## 2010-11-30 DIAGNOSIS — S8010XA Contusion of unspecified lower leg, initial encounter: Secondary | ICD-10-CM | POA: Insufficient documentation

## 2010-11-30 DIAGNOSIS — M545 Low back pain, unspecified: Secondary | ICD-10-CM | POA: Insufficient documentation

## 2010-11-30 DIAGNOSIS — M79609 Pain in unspecified limb: Secondary | ICD-10-CM | POA: Insufficient documentation

## 2010-11-30 DIAGNOSIS — S40029A Contusion of unspecified upper arm, initial encounter: Secondary | ICD-10-CM | POA: Insufficient documentation

## 2010-11-30 DIAGNOSIS — M542 Cervicalgia: Secondary | ICD-10-CM | POA: Insufficient documentation

## 2010-11-30 DIAGNOSIS — M2569 Stiffness of other specified joint, not elsewhere classified: Secondary | ICD-10-CM | POA: Insufficient documentation

## 2010-12-06 ENCOUNTER — Ambulatory Visit (INDEPENDENT_AMBULATORY_CARE_PROVIDER_SITE_OTHER): Payer: Self-pay | Admitting: *Deleted

## 2010-12-06 ENCOUNTER — Ambulatory Visit: Payer: Self-pay

## 2010-12-06 VITALS — Temp 98.2°F

## 2010-12-06 DIAGNOSIS — Z23 Encounter for immunization: Secondary | ICD-10-CM

## 2010-12-06 DIAGNOSIS — Z3202 Encounter for pregnancy test, result negative: Secondary | ICD-10-CM

## 2010-12-06 DIAGNOSIS — Z111 Encounter for screening for respiratory tuberculosis: Secondary | ICD-10-CM

## 2010-12-06 LAB — POCT URINE PREGNANCY: Preg Test, Ur: NEGATIVE

## 2010-12-06 MED ORDER — MEASLES, MUMPS & RUBELLA VAC ~~LOC~~ INJ: 0.5000 mL | INJECTION | Freq: Once | SUBCUTANEOUS | Status: DC

## 2010-12-08 ENCOUNTER — Ambulatory Visit (INDEPENDENT_AMBULATORY_CARE_PROVIDER_SITE_OTHER): Payer: Self-pay | Admitting: *Deleted

## 2010-12-08 DIAGNOSIS — Z111 Encounter for screening for respiratory tuberculosis: Secondary | ICD-10-CM

## 2010-12-08 DIAGNOSIS — IMO0001 Reserved for inherently not codable concepts without codable children: Secondary | ICD-10-CM

## 2010-12-08 LAB — TB SKIN TEST
Induration: 0
TB Skin Test: NEGATIVE mm

## 2010-12-08 NOTE — Progress Notes (Signed)
PPD negative-0 mm. 

## 2011-01-06 ENCOUNTER — Ambulatory Visit: Payer: No Typology Code available for payment source

## 2011-01-11 ENCOUNTER — Ambulatory Visit (INDEPENDENT_AMBULATORY_CARE_PROVIDER_SITE_OTHER): Payer: No Typology Code available for payment source | Admitting: *Deleted

## 2011-01-11 DIAGNOSIS — Z23 Encounter for immunization: Secondary | ICD-10-CM

## 2011-01-11 MED ORDER — MEASLES, MUMPS & RUBELLA VAC ~~LOC~~ INJ: 0.5000 mL | INJECTION | Freq: Once | SUBCUTANEOUS | Status: DC

## 2011-02-28 NOTE — L&D Delivery Note (Signed)
I have seen and examined this patient and I agree with the above. Cam Hai 8:57 AM 02/27/2012

## 2011-02-28 NOTE — L&D Delivery Note (Signed)
Delivery Note At 8:02 AM a viable female was delivered via Vaginal, Spontaneous Delivery (Presentation: ; Occiput Anterior).  APGAR: 9, 9; weight pending.   Placenta status: Intact, Spontaneous.  Cord: 3 vessels with the following complications: None.  Cord pH: not drawn Of note, there was a loose nuchal cord delivered through.  Philipp Deputy, CNM was present for and assisted with the delivery in its entirety.  Anesthesia: Epidural  Episiotomy: None Lacerations: None Suture Repair: n/a Est. Blood Loss (mL): 350  Mom to postpartum.  Baby to nursery-stable.  Lillia Lengel 02/27/2012, 8:14 AM

## 2011-04-20 ENCOUNTER — Encounter: Payer: Self-pay | Admitting: Family Medicine

## 2011-04-20 ENCOUNTER — Ambulatory Visit (INDEPENDENT_AMBULATORY_CARE_PROVIDER_SITE_OTHER): Payer: No Typology Code available for payment source | Admitting: Family Medicine

## 2011-04-20 DIAGNOSIS — N760 Acute vaginitis: Secondary | ICD-10-CM

## 2011-04-20 LAB — POCT WET PREP (WET MOUNT)
Clue Cells Wet Prep Whiff POC: POSITIVE
WBC, Wet Prep HPF POC: >20

## 2011-04-20 MED ORDER — METRONIDAZOLE 500 MG PO TABS: 500.0000 mg | ORAL_TABLET | Freq: Two times a day (BID) | ORAL | Status: AC

## 2011-04-20 NOTE — Assessment & Plan Note (Signed)
Vaginal discharge x 1 week. Wet prep done consistent with BV. Normal pap in 09/2010.  Treat with flagyl.

## 2011-04-20 NOTE — Progress Notes (Signed)
Subjective:     Patient ID: [name], female   DOB: 1976/02/13, 36 y.o.   MRN: [id number]  HPI Patient reports watery white vaginal discharge from 1 week. She denies vaginal bleeding, irritation, itching or odor. She denies abdominal pain. She and her husband are trying to have another baby and are not using condoms.   Review of Systems As per HPI    Objective:   Physical Exam BP 108/70  Pulse 72  Temp(Src) 98.3 F (36.8 C) (Oral)  Ht 5\' 6"  (1.676 m)  Wt 163 lb (73.936 kg)  BMI 26.31 kg/m2  LMP 03/31/2011 General appearance: alert, cooperative and no distress Abdomen: soft, non-tender; bowel sounds normal; no masses,  no organomegaly Pelvic: cervix normal in appearance, external genitalia normal, no adnexal masses or tenderness, no cervical motion tenderness, uterus normal size, shape, and consistency and thin white vaginal discharge.      Assessment:         Plan:

## 2011-04-20 NOTE — Patient Instructions (Signed)
[name],  You do have BV. I have sent flagyl to your pharmacy.  Dr. Armen Pickup

## 2011-07-20 ENCOUNTER — Ambulatory Visit (INDEPENDENT_AMBULATORY_CARE_PROVIDER_SITE_OTHER): Payer: Self-pay | Admitting: Family Medicine

## 2011-07-20 VITALS — BP 109/74 | HR 88 | Temp 98.3°F | Ht 66.0 in | Wt 169.0 lb

## 2011-07-20 DIAGNOSIS — N912 Amenorrhea, unspecified: Secondary | ICD-10-CM

## 2011-07-20 DIAGNOSIS — O99345 Other mental disorders complicating the puerperium: Secondary | ICD-10-CM | POA: Insufficient documentation

## 2011-07-20 DIAGNOSIS — Z3201 Encounter for pregnancy test, result positive: Secondary | ICD-10-CM

## 2011-07-20 DIAGNOSIS — F53 Postpartum depression: Secondary | ICD-10-CM

## 2011-07-20 HISTORY — DX: Other mental disorders complicating the puerperium: O99.345

## 2011-07-20 HISTORY — DX: Postpartum depression: F53.0

## 2011-07-20 LAB — POCT URINE PREGNANCY: Preg Test, Ur: POSITIVE

## 2011-07-20 MED ORDER — PRENATAL LOW IRON 27-1 MG PO TABS: 1.0000 | ORAL_TABLET | Freq: Every day | ORAL | Status: DC

## 2011-07-20 MED ORDER — PYRIDOXINE HCL 50 MG PO TABS: 50.0000 mg | ORAL_TABLET | Freq: Every day | ORAL | Status: DC

## 2011-07-20 NOTE — Patient Instructions (Signed)
[name],  Thank you for coming in today. Congratulations on your pregnancy. Please obtain pregnancy medicaid. Once you have it call and to schedule OB labs and new OB visit.   Take prenatal vitamins. Take B6 for nausea.   Dr. Armen Pickup

## 2011-07-20 NOTE — Progress Notes (Signed)
Patient ID: [name], female   DOB: 20-Jun-1975, 36 y.o.   MRN: [id number]  Subjective: Missed Menses/ Possible Pregnancy Patient complains of amenorrhea. She believes she could be pregnant. Pregnancy is unplanned but desired if positive. Sexual Activity: single partner, contraception: none. Current symptoms also include: fatigue, frequent urination, nausea and positive home pregnancy test. Last period was normal. Cannot recall for sure if she had a period in April. Last period noted for sure on calendar as 05/02/11.   Reviewed social history: non-smoker, denies alcohol intake.   ROS: as per HPI  Objective:  BP 109/74  Pulse 88  Temp(Src) 98.3 F (36.8 C) (Oral)  Ht 5\' 6"  (1.676 m)  Wt 169 lb (76.658 kg)  BMI 27.28 kg/m2  LMP 05/29/2011 General appearance: alert, cooperative and no distress Abdomen: soft, non-tender; bowel sounds normal; no masses,  no organomegaly Extremities: extremities normal, atraumatic, no cyanosis or edema  Urine pregnancy test: positive  Assessment and Plan: A: positive urine pregnancy test.  P: -pregnancy verification letter provided.  -start prenatal vitamins  -start B6 for nausea -will need ultrasound for dating

## 2011-07-20 NOTE — Assessment & Plan Note (Signed)
A: positive pregnancy. Unsure LMP and therefore unsure gestational age. Nausea and fatigue in pregnancy.  P: -pregnancy verification letter provided.  -advised patient to call once pregnancy medicaid obtained. -start B12 and prenatal vitamins  -will send note to Terese Door to set up new OB labs and initial new OB visit.

## 2011-07-31 ENCOUNTER — Encounter: Payer: Self-pay | Admitting: Family Medicine

## 2011-08-19 ENCOUNTER — Encounter (HOSPITAL_COMMUNITY): Payer: Self-pay | Admitting: *Deleted

## 2011-08-19 ENCOUNTER — Inpatient Hospital Stay (HOSPITAL_COMMUNITY)
Admission: AD | Admit: 2011-08-19 | Discharge: 2011-08-19 | Disposition: A | Discharge status: Home / Self Care | Payer: Medicaid Other | Source: Ambulatory Visit | Attending: Family Medicine | Admitting: Family Medicine

## 2011-08-19 DIAGNOSIS — R42 Dizziness and giddiness: Secondary | ICD-10-CM | POA: Insufficient documentation

## 2011-08-19 DIAGNOSIS — O210 Mild hyperemesis gravidarum: Secondary | ICD-10-CM | POA: Insufficient documentation

## 2011-08-19 DIAGNOSIS — M545 Low back pain, unspecified: Secondary | ICD-10-CM | POA: Insufficient documentation

## 2011-08-19 DIAGNOSIS — O219 Vomiting of pregnancy, unspecified: Secondary | ICD-10-CM

## 2011-08-19 DIAGNOSIS — O99891 Other specified diseases and conditions complicating pregnancy: Secondary | ICD-10-CM | POA: Insufficient documentation

## 2011-08-19 HISTORY — DX: Urinary tract infection, site not specified: N39.0

## 2011-08-19 LAB — URINALYSIS, ROUTINE W REFLEX MICROSCOPIC
Bilirubin Urine: NEGATIVE
Glucose, UA: NEGATIVE mg/dL
Hgb urine dipstick: NEGATIVE
Ketones, ur: 15 mg/dL — AB
Leukocytes, UA: NEGATIVE
Nitrite: NEGATIVE
Protein, ur: NEGATIVE mg/dL
Specific Gravity, Urine: >1.03 — ABNORMAL HIGH (ref 1.005–1.030)
Urobilinogen, UA: 0.2 mg/dL (ref 0.0–1.0)
pH: 6 (ref 5.0–8.0)

## 2011-08-19 MED ORDER — ONDANSETRON 8 MG PO TBDP
8.0000 mg | ORAL_TABLET | Freq: Three times a day (TID) | ORAL | Status: AC | PRN
Start: 2011-08-19 — End: 2011-08-26

## 2011-08-19 MED ORDER — ONDANSETRON 8 MG PO TBDP
8.0000 mg | ORAL_TABLET | Freq: Once | ORAL | Status: AC
  Administered 2011-08-19: 8 mg via ORAL
  Filled 2011-08-19: qty 1

## 2011-08-19 MED ORDER — PROMETHAZINE HCL 25 MG PO TABS: 25.0000 mg | ORAL_TABLET | Freq: Four times a day (QID) | ORAL | Status: DC | PRN

## 2011-08-19 NOTE — MAU Note (Signed)
2 days ago started with headaches, then lightheadness and dizziness started.  Feels flu like.  Now having pain on right crampy pain on bottom and them sore on the upper right.   No bleeding

## 2011-08-19 NOTE — MAU Provider Note (Signed)
Chart reviewed and agree with management and plan.  

## 2011-08-19 NOTE — MAU Provider Note (Signed)
History     CSN: [id number]  Arrival date and time: 08/19/11 1222   First Provider Initiated Contact with Patient 08/19/11 1356      Chief Complaint  Patient presents with  . Dizziness   HPI [name] 36 y.o. [redacted]w[redacted]d  Comes to MAU with dizziness for several days.  Becomes lightheaded but has not passed out.  Has been having lots of nausea and vomiting.  Worked today.  But only ate 8 hours ago.  Has some pain in right side at waist level.  Is lifting tea at work.  Some low back pain.  Denies dysuria, vaginal bleeding and vaginal discharge.  Has an appointment for prenatal care at [facility] on July 3.  OB History    Grav Para Term Preterm Abortions TAB SAB Ect Mult Living   9 4 4  0 4 4    4       Past Medical History  Diagnosis Date  . UTI (lower urinary tract infection)     history of     History reviewed. No pertinent past surgical history.  History reviewed. No pertinent family history.  History  Substance Use Topics  . Smoking status: Former Games developer  . Smokeless tobacco: Never Used  . Alcohol Use: No    Allergies: No Known Allergies  Prescriptions prior to admission  Medication Sig Dispense Refill  . Prenatal Vit-Fe Fumarate-FA (PRENATAL LOW IRON) 27-1 MG TABS Take 1 tablet by mouth daily.  120 each  2  . pyridOXINE (B-6) 50 MG tablet Take 1 tablet (50 mg total) by mouth daily.  60 tablet  6    Review of Systems  Gastrointestinal: Positive for nausea and vomiting. Negative for abdominal pain, diarrhea and constipation.       Pain in right side  Genitourinary: Negative for dysuria.       No vaginal bleeding No vaginal discharge.   Physical Exam   Blood pressure 110/74, pulse 89, temperature 98 F (36.7 C), temperature source Oral, resp. rate 18, height 5\' 6"  (1.676 m), weight 160 lb (72.576 kg), last menstrual period 05/29/2011.  Physical Exam  Nursing note and vitals reviewed. Constitutional: She is oriented to person, place, and time. She  appears well-developed and well-nourished.  HENT:  Head: Normocephalic.  Eyes: EOM are normal.  Neck: Neck supple.  Respiratory: No respiratory distress.  GI: Soft. There is no tenderness.  Musculoskeletal: Normal range of motion.  Neurological: She is alert and oriented to person, place, and time.  Skin: Skin is warm and dry.  Psychiatric: She has a normal mood and affect.    MAU Course  Procedures  MDM Results for orders placed during the hospital encounter of 08/19/11 (from the past 24 hour(s))  URINALYSIS, ROUTINE W REFLEX MICROSCOPIC     Status: Abnormal   Collection Time   08/19/11 12:22 PM      Component Value Range   Color, Urine YELLOW  YELLOW   APPearance HAZY (*) CLEAR   Specific Gravity, Urine >1.030 (*) 1.005 - 1.030   pH 6.0  5.0 - 8.0   Glucose, UA NEGATIVE  NEGATIVE mg/dL   Hgb urine dipstick NEGATIVE  NEGATIVE   Bilirubin Urine NEGATIVE  NEGATIVE   Ketones, ur 15 (*) NEGATIVE mg/dL   Protein, ur NEGATIVE  NEGATIVE mg/dL   Urobilinogen, UA 0.2  0.0 - 1.0 mg/dL   Nitrite NEGATIVE  NEGATIVE   Leukocytes, UA NEGATIVE  NEGATIVE    Assessment and Plan  Morning  sickness Dizziness  Plan Will prescribe Phenergan and Zofran Discussed at length dizziness as a symptoms and the need to eat every 2-3 hours and to get appropriate rest. Drink at least 8 8-oz glasses of water every day. Stop drinking high sugar drinks. Zofran ODT given in MAU. Keep appointment at [facility] on July 3.   Nakaya Mishkin 08/19/2011, 2:30 PM

## 2011-08-30 ENCOUNTER — Other Ambulatory Visit (HOSPITAL_COMMUNITY)
Admission: RE | Admit: 2011-08-30 | Discharge: 2011-08-30 | Disposition: A | Discharge status: Home / Self Care | Payer: Medicaid Other | Source: Ambulatory Visit | Attending: Family Medicine | Admitting: Family Medicine

## 2011-08-30 ENCOUNTER — Ambulatory Visit (INDEPENDENT_AMBULATORY_CARE_PROVIDER_SITE_OTHER): Payer: Medicaid Other | Admitting: Family Medicine

## 2011-08-30 ENCOUNTER — Encounter: Payer: Self-pay | Admitting: Family Medicine

## 2011-08-30 VITALS — BP 100/67 | Temp 98.3°F | Wt 168.0 lb

## 2011-08-30 DIAGNOSIS — Z113 Encounter for screening for infections with a predominantly sexual mode of transmission: Secondary | ICD-10-CM | POA: Insufficient documentation

## 2011-08-30 DIAGNOSIS — Z3480 Encounter for supervision of other normal pregnancy, unspecified trimester: Secondary | ICD-10-CM

## 2011-08-30 DIAGNOSIS — Z3490 Encounter for supervision of normal pregnancy, unspecified, unspecified trimester: Secondary | ICD-10-CM

## 2011-08-30 DIAGNOSIS — Z124 Encounter for screening for malignant neoplasm of cervix: Secondary | ICD-10-CM

## 2011-08-30 DIAGNOSIS — Z01419 Encounter for gynecological examination (general) (routine) without abnormal findings: Secondary | ICD-10-CM | POA: Insufficient documentation

## 2011-08-30 LAB — GC/CHLAMYDIA PROBE AMP, GENITAL
Chlamydia Probe Amp: NEGATIVE
GC Probe Amp, Genital: NEGATIVE

## 2011-08-30 LAB — GONORRHEA SCREEN
Chlamydia Probe Amp: NEGATIVE
Gonococcus by Nucleic Acid Amp: NEGATIVE
Strep Group B Ag: NEGATIVE

## 2011-08-30 LAB — OB RESULTS CONSOLE GC/CHLAMYDIA: Chlamydia: NEGATIVE

## 2011-08-30 MED ORDER — ONDANSETRON 8 MG PO TBDP: 8.0000 mg | ORAL_TABLET | Freq: Three times a day (TID) | ORAL | Status: DC | PRN

## 2011-08-30 NOTE — Patient Instructions (Addendum)
[name],  Thank you for coming in to see me today.  Please f/u in 3 weeks for f/u OB visit. At this time we will get your 1 hr glucola and your quad screening for genetic testing (this has to be done between   Your ultrasounds are schduled for 09/06/11.   Drink plenty of fluids, avoid raw fish and and cold deli meet. You may eat cooked fish weekly. Take prenatals as much as possible, try gummies if unable to tolerate others.  Dr. Armen Pickup

## 2011-08-30 NOTE — Assessment & Plan Note (Signed)
A: normal pregnancy with nausea that is improved with Zofran. Patient able to tolerate prenatal vitamin 5x per week. P: -OB ultrasound for dating. -early 1 hr glucola, quad screen and pregnancy medical home form at f/u visit.  -OB labs done today.  -f/u in 3 weeks.

## 2011-08-30 NOTE — Progress Notes (Signed)
Subjective:    [name] is a [id number] [redacted]w[redacted]d being seen today for her first obstetrical visit.  Her obstetrical history is significant for uncomplicated pregnancies/deliveries.. Patient does intend to breast feed. Pregnancy history fully reviewed.  Patient reports fatigue, nausea, no bleeding, no contractions, no cramping, no leaking and feeling lightheaded.Marland Kitchen  HISTORY: OB History    Grav Para Term Preterm Abortions TAB SAB Ect Mult Living   9 4 4  0 4 4    4      # Outc Date GA Lbr Len/2nd Wgt Sex Del Anes PTL Lv   1 TRM            2 TRM            3 TRM            4 TRM            5 TAB            6 TAB            7 TAB            8 TAB            9 CUR              Past Medical History  Diagnosis Date  . UTI (lower urinary tract infection)     history of    No past surgical history on file. Family History  Problem Relation Age of Onset  . Diabetes Mother   . Drug abuse Maternal Grandmother    Exam  BP 100/67  Temp 98.3 F (36.8 C)  Wt 168 lb (76.204 kg)  LMP 05/29/2011  Uterus:     Pelvic Exam:    Perineum: No Hemorrhoids, Normal Perineum   Vulva: normal   Vagina:  normal mucosa, curdlike discharge   Cervix: multiparous appearance, no cervical motion tenderness and no lesions   Adnexa: normal adnexa   Bony Pelvis: anthropoid and average   Skin: normal coloration and turgor, no rashes    Neurologic: oriented, normal   Extremities: normal strength, tone, and muscle mass   HEENT PERRLA, extra ocular movement intact, sclera clear, anicteric, oropharynx clear, no lesions, neck supple with midline trachea, thyroid without masses and trachea midline   Mouth/Teeth mucous membranes moist, pharynx normal without lesions and dental hygiene good   Neck supple and no masses   Cardiovascular: regular rate and rhythm, no murmurs or gallops   Respiratory:  appears well, vitals normal, no respiratory distress, acyanotic, normal RR, ear and throat exam is normal, neck  free of mass or lymphadenopathy, chest clear, no wheezing, crepitations, rhonchi, normal symmetric air entry   Abdomen: soft, non-tender; bowel sounds normal; no masses,  no organomegaly   Urinary: urethral meatus normal      Assessment:    Pregnancy: [id number] Patient Active Problem List  Diagnosis  . CHEST PAIN UNSPECIFIED  . GENERALIZED ANXIETY DISORDER  . Supervision of normal pregnancy        Plan:     Initial labs drawn. Prenatal vitamins. Problem list reviewed and updated. Genetic Screening discussed Quad Screen: requested and ordered for f/u visit.   Ultrasound discussed; fetal survey: requested and ordered for dating as patient is unsure of her LMP.   Follow up in 3 weeks. Early 1 H GTT it f/u visit given family history. Patient to fill out pregnancy medical home for at f/u visit. Contraception: tubal ligation requested.  Dessa Phi 08/30/2011

## 2011-08-31 LAB — OBSTETRIC PANEL
Antibody Screen: NEGATIVE
Basophils Absolute: 0 10*3/uL (ref 0.0–0.1)
Basophils Relative: 0 % (ref 0–1)
Eosinophils Absolute: 0.1 10*3/uL (ref 0.0–0.7)
Eosinophils Relative: 2 % (ref 0–5)
HCT: 35.8 % — ABNORMAL LOW (ref 36.0–46.0)
Hemoglobin: 12.2 g/dL (ref 12.0–15.0)
Hepatitis B Surface Ag: NEGATIVE
Lymphocytes Relative: 27 % (ref 12–46)
Lymphs Abs: 1.5 10*3/uL (ref 0.7–4.0)
MCH: 28.8 pg (ref 26.0–34.0)
MCHC: 34.1 g/dL (ref 30.0–36.0)
MCV: 84.4 fL (ref 78.0–100.0)
Monocytes Absolute: 0.3 10*3/uL (ref 0.1–1.0)
Monocytes Relative: 6 % (ref 3–12)
Neutro Abs: 3.8 10*3/uL (ref 1.7–7.7)
Neutrophils Relative %: 65 % (ref 43–77)
Platelets: 265 10*3/uL (ref 150–400)
RBC: 4.24 MIL/uL (ref 3.87–5.11)
RDW: 14.5 % (ref 11.5–15.5)
Rh Type: POSITIVE
Rubella: 378.3 IU/mL — ABNORMAL HIGH
WBC: 5.8 10*3/uL (ref 4.0–10.5)

## 2011-08-31 LAB — HIV ANTIBODY (ROUTINE TESTING W REFLEX): HIV: NONREACTIVE

## 2011-08-31 LAB — SICKLE CELL SCREEN: Sickle Cell Screen: NEGATIVE

## 2011-09-01 LAB — CULTURE, OB URINE
Colony Count: NO GROWTH
Organism ID, Bacteria: NO GROWTH

## 2011-09-04 NOTE — Progress Notes (Signed)
Note reviewed. Agree with plan. +FH DM - needs early glucola. PP contraception - desires BTL. Infant feeding - Breast. Will need to assess FHT next visit.

## 2011-09-06 ENCOUNTER — Ambulatory Visit (HOSPITAL_COMMUNITY)
Admission: RE | Admit: 2011-09-06 | Discharge: 2011-09-06 | Disposition: A | Discharge status: Home / Self Care | Payer: Medicaid Other | Source: Ambulatory Visit | Attending: Family Medicine | Admitting: Family Medicine

## 2011-09-06 ENCOUNTER — Other Ambulatory Visit: Payer: Self-pay | Admitting: Family Medicine

## 2011-09-06 DIAGNOSIS — Z3490 Encounter for supervision of normal pregnancy, unspecified, unspecified trimester: Secondary | ICD-10-CM

## 2011-09-06 DIAGNOSIS — Z3689 Encounter for other specified antenatal screening: Secondary | ICD-10-CM | POA: Insufficient documentation

## 2011-09-20 ENCOUNTER — Ambulatory Visit (INDEPENDENT_AMBULATORY_CARE_PROVIDER_SITE_OTHER): Payer: Medicaid Other | Admitting: Family Medicine

## 2011-09-20 ENCOUNTER — Encounter: Payer: Self-pay | Admitting: Family Medicine

## 2011-09-20 VITALS — BP 104/72 | Temp 98.4°F | Wt 173.0 lb

## 2011-09-20 DIAGNOSIS — Z3490 Encounter for supervision of normal pregnancy, unspecified, unspecified trimester: Secondary | ICD-10-CM

## 2011-09-20 DIAGNOSIS — Z331 Pregnant state, incidental: Secondary | ICD-10-CM

## 2011-09-20 DIAGNOSIS — Z3480 Encounter for supervision of other normal pregnancy, unspecified trimester: Secondary | ICD-10-CM

## 2011-09-20 LAB — GLUCOSE, CAPILLARY
Comment 1: 1
Glucose-Capillary: 135 mg/dL — ABNORMAL HIGH (ref 70–99)

## 2011-09-20 MED ORDER — ONDANSETRON 8 MG PO TBDP: 8.0000 mg | ORAL_TABLET | Freq: Three times a day (TID) | ORAL | Status: DC | PRN

## 2011-09-20 NOTE — Patient Instructions (Signed)
Vielka,  Thank you for coming in today.  Please continue your prenatal vitamin and zofran as needed.  F/u in 4 weeks.  Watch out for bleeding both painful and painless bleeding in moderate to large flow is a reason to get checked here or at MAU.   Dr. Armen Pickup

## 2011-09-20 NOTE — Progress Notes (Signed)
S: 36 yo F presents for f/u OB visit. She has no complaints. Still taking zofran for intermittent nausea. Taking PNV about every other day.  O: see flowsheet. A/P:  36 yo Multigravida doing well. 1. Genetic screening: has U/S needs blood draw today.  2. Nausea: improving continue PNV 3. Schedule anatomy U/S 4. Early 1 hr glucola due to +FH of DM 135. No need for 3 hr glucola.  5/ F/u in 4 weeks.

## 2011-10-09 ENCOUNTER — Ambulatory Visit (HOSPITAL_COMMUNITY)
Admission: RE | Admit: 2011-10-09 | Discharge: 2011-10-09 | Disposition: A | Discharge status: Home / Self Care | Payer: Medicaid Other | Source: Ambulatory Visit | Attending: Family Medicine | Admitting: Family Medicine

## 2011-10-09 DIAGNOSIS — Z3490 Encounter for supervision of normal pregnancy, unspecified, unspecified trimester: Secondary | ICD-10-CM

## 2011-10-09 DIAGNOSIS — O358XX0 Maternal care for other (suspected) fetal abnormality and damage, not applicable or unspecified: Secondary | ICD-10-CM | POA: Insufficient documentation

## 2011-10-09 DIAGNOSIS — Z363 Encounter for antenatal screening for malformations: Secondary | ICD-10-CM | POA: Insufficient documentation

## 2011-10-09 DIAGNOSIS — Z1389 Encounter for screening for other disorder: Secondary | ICD-10-CM | POA: Insufficient documentation

## 2011-10-17 ENCOUNTER — Encounter: Payer: Medicaid Other | Admitting: Family Medicine

## 2011-12-06 ENCOUNTER — Ambulatory Visit (INDEPENDENT_AMBULATORY_CARE_PROVIDER_SITE_OTHER): Payer: Medicaid Other | Admitting: Family Medicine

## 2011-12-06 VITALS — BP 102/69 | Wt 182.0 lb

## 2011-12-06 DIAGNOSIS — Z331 Pregnant state, incidental: Secondary | ICD-10-CM

## 2011-12-06 DIAGNOSIS — Z3480 Encounter for supervision of other normal pregnancy, unspecified trimester: Secondary | ICD-10-CM

## 2011-12-06 DIAGNOSIS — Z3490 Encounter for supervision of normal pregnancy, unspecified, unspecified trimester: Secondary | ICD-10-CM

## 2011-12-06 LAB — CBC
HCT: 36.4 % (ref 36.0–46.0)
Hemoglobin: 11.8 g/dL — ABNORMAL LOW (ref 12.0–15.0)
MCH: 29 pg (ref 26.0–34.0)
MCHC: 32.4 g/dL (ref 30.0–36.0)
MCV: 89.4 fL (ref 78.0–100.0)
Platelets: 287 10*3/uL (ref 150–400)
RBC: 4.07 MIL/uL (ref 3.87–5.11)
RDW: 13.4 % (ref 11.5–15.5)
WBC: 6.9 10*3/uL (ref 4.0–10.5)

## 2011-12-06 LAB — GLUCOSE, CAPILLARY
Comment 1: 1
Glucose-Capillary: 122 mg/dL — ABNORMAL HIGH (ref 70–99)

## 2011-12-06 LAB — RPR

## 2011-12-06 LAB — HIV ANTIBODY (ROUTINE TESTING W REFLEX): HIV: NONREACTIVE

## 2011-12-06 NOTE — Addendum Note (Signed)
Addended by: Dessa Phi on: 12/06/2011 10:21 AM   Modules accepted: Orders

## 2011-12-06 NOTE — Patient Instructions (Signed)
Murphie,  Thank you for coming in today.  1. Tdap was last October 2012. You are covered.  2. Labs today, 1 hr glucola, HIV, RPR, CBC. 3. Tubal papers signed today. Keep thinking about it.  4. Letter for work  Please watch out for signs of preterm labor: vaginal bleeding, leakage or gush of fluids, 4 or more contractions for 2 or more hours. Please call the clinic if any of these occur and go to [facility].  Please watch out for decreased fetal movement: If you feel that the baby is not moving as well as usual. You should count kicks. Go to a quite room and count each kick. Anything less than 10 kicks an hour for 2 or more hours is decreased/low. If this happens try drinking cool water. If that does not help. Call the clinic/[facility] to [facility].   Schedule f/u with OB clinic for 2 weeks.  See me again in 4 weeks.   Dr. Armen Pickup

## 2011-12-06 NOTE — Progress Notes (Signed)
S: 36 yo multigravida presents for f/u OB visit at [redacted]w[redacted]d. EDD 03/04/12. No complaints except for contractions when she works long hours. She declines flu shot. Received Tdap on October 2012. Declines parenting classes. Plans to bottle feed. Is not sure about tubal ligation but is willing to sign papers today.  O: see flowsheet.  A/P: 36 yo multigravida doing well.  Reviewed prenatal labs. Reviewed anatomy ultrasound. Female baby. Normal anatomy. EFW 54%. EDD based [redacted]w[redacted]d U/S, 03/02/12. LMP EDD 03/05/11  still working EDD.  CBC, HIV, RPR, repeat 1 hr glucola today.  Contraception: ? Tubal.  Feeding: bottle.

## 2011-12-19 ENCOUNTER — Encounter: Payer: Medicaid Other | Admitting: Family Medicine

## 2012-01-01 ENCOUNTER — Encounter: Payer: Medicaid Other | Admitting: Family Medicine

## 2012-01-04 ENCOUNTER — Ambulatory Visit: Payer: Medicaid Other | Admitting: Family Medicine

## 2012-01-04 VITALS — BP 103/71 | Wt 187.0 lb

## 2012-01-04 DIAGNOSIS — Z3490 Encounter for supervision of normal pregnancy, unspecified, unspecified trimester: Secondary | ICD-10-CM

## 2012-01-04 NOTE — Patient Instructions (Addendum)
It was nice to meet you today. Please let us know if you have any questions. If your baby is not moving well, if you have 4 or more contractions in an hour, if you have vaginal bleeding or if your water breaks, please go to [facility]. We will schedule an appt in 2 weeks for you.

## 2012-01-05 NOTE — Progress Notes (Signed)
[name] is a 36 y.o. [phone] [redacted]w[redacted]d for routine follow up.  She reports no complaints.  See flow sheet for details.  A/P: Pregnancy at [redacted]w[redacted]d.  Doing well.   Pregnancy issues include Grand multip.  Contraception discussed at length. Declines flu shot because she doesn't" trust that the vaccine actually has what they say it has in it." Size > dates.  Will monitor fundal height.  If still persistently different, will consider ultrasound.  Infant feeding choice formula Contraception choice None, or maybe BTL.  Would be fine with a BTL, but states that she does not want an epidural or general anesthesia.  If she has to have a section for this delivery, she would like a BTL.  If she delivers without epidural, she prefers not to have the BTL. Declines any other forms of birth control - states she has used them all and does not like any of the.  Reports she knows "how to time things".     Tdapwas not given today. (given previously) GBS/GC/CZ testing was not performed today.  Preterm labor precautions reviewed. Kick counts reviewed. Follow up 2 weeks.

## 2012-01-13 ENCOUNTER — Encounter: Payer: Self-pay | Admitting: Family Medicine

## 2012-01-15 ENCOUNTER — Telehealth: Payer: Self-pay | Admitting: Family Medicine

## 2012-01-15 NOTE — Telephone Encounter (Signed)
Pt states that she lost her mucus plug today and wants to know what she should do now.  Wants to know if she needs to stop work - pls advise

## 2012-01-15 NOTE — Telephone Encounter (Signed)
Spoke with MD.  She advised to continue working and regular activity, if contractions go to MAU.  Otherwise she her on Thursday.  Pt informed and agreeable. Alverna Fawley, Maryjo Rochester

## 2012-01-18 ENCOUNTER — Ambulatory Visit (INDEPENDENT_AMBULATORY_CARE_PROVIDER_SITE_OTHER): Payer: Medicaid Other | Admitting: Family Medicine

## 2012-01-18 ENCOUNTER — Telehealth: Payer: Self-pay | Admitting: Family Medicine

## 2012-01-18 VITALS — BP 104/69 | Wt 190.0 lb

## 2012-01-18 DIAGNOSIS — Z3480 Encounter for supervision of other normal pregnancy, unspecified trimester: Secondary | ICD-10-CM

## 2012-01-18 DIAGNOSIS — Z3490 Encounter for supervision of normal pregnancy, unspecified, unspecified trimester: Secondary | ICD-10-CM

## 2012-01-18 DIAGNOSIS — Z331 Pregnant state, incidental: Secondary | ICD-10-CM

## 2012-01-18 LAB — POCT WET PREP (WET MOUNT): Clue Cells Wet Prep Whiff POC: NEGATIVE

## 2012-01-18 LAB — POCT FERNING: Ferning, POC: NEGATIVE

## 2012-01-18 NOTE — Telephone Encounter (Signed)
Called to ask patient if she was still interested in tubal ligation. If so she will have to come in to sign tubal papers. Papers need to be signed at lest 4 weeks prior to delivery.

## 2012-01-18 NOTE — Progress Notes (Signed)
S: 36 yo F presents for f/u OB visit at [redacted]w[redacted]d days. She reports low back pain with prolonged standings and watery/mucoid discharge. Irregular contractions. She has not had sex in many weeks.  O: See flowsheet  Sterile speculum: White vaginal discharge with cervical mucus.  Negative pooling.  Nitrazine blue negative.  Fern test : negative  Wet prep: rods and white cells, otherwise negative  A/P:  Reassuring exam. Labs reviewed.  Membranes intact from assessment.  F/u in 2 weeks.  GC/chlam and GBS at f/u.  Called patient at home left VM about tubal. If she desires it she will have to come in to sign the tubal papers.

## 2012-01-18 NOTE — Patient Instructions (Addendum)
[name],   Thank you for coming in to see me today. Your membranes are intact.   Please f/u in 2 weeks.  I will repeat GC/chlamydia and GBS at the f/u visit.   Drink plenty of fluids.   Please watch out for signs of preterm labor: vaginal bleeding, leakage or gush of fluids, 4 or more contractions for 2 or more hours. Please call the clinic if any of these occur and go to [facility].   Please watch out for decreased fetal movement: If you feel that the baby is not moving as well as usual. You should count kicks. Go to a quite room and count each kick. Anything less than 10 kicks an hour for 2 or more hours is decreased/low. If this happens try drinking cool water. If that does not help. Call the clinic[facility].   Dr. Armen Pickup

## 2012-01-30 ENCOUNTER — Ambulatory Visit (INDEPENDENT_AMBULATORY_CARE_PROVIDER_SITE_OTHER): Payer: Medicaid Other | Admitting: Family Medicine

## 2012-01-30 VITALS — BP 106/72 | Temp 98.5°F | Wt 192.0 lb

## 2012-01-30 DIAGNOSIS — Z3480 Encounter for supervision of other normal pregnancy, unspecified trimester: Secondary | ICD-10-CM

## 2012-01-30 DIAGNOSIS — Z3490 Encounter for supervision of normal pregnancy, unspecified, unspecified trimester: Secondary | ICD-10-CM

## 2012-01-30 NOTE — Progress Notes (Signed)
S: 35w1 day no complaints. Stopped working.  O: see flowsheet A/P:  F/u in one week for next visit GC/Chlam and GBS at f/u.  Reviewed PTL and kick counts Tubal papers signed in October. See media>uncheck default> consent form 12/15/11.

## 2012-01-30 NOTE — Patient Instructions (Signed)
[name],   Thank you for coming in to see me today.  Please f/u in 1 weeks.  I will repeat GC/chlamydia and GBS at the f/u visit.   Please watch out for signs of preterm labor: vaginal bleeding, leakage or gush of fluids, 4 or more contractions for 2 or more hours. Please call the clinic if any of these occur and go to [facility].   Please watch out for decreased fetal movement: If you feel that the baby is not moving as well as usual. You should count kicks. Go to a quite room and count each kick. Anything less than 10 kicks an hour for 2 or more hours is decreased/low. If this happens try drinking cool water. If that does not help. Call the clinic[facility] to be [facility].   Dr. Armen Pickup

## 2012-01-30 NOTE — Assessment & Plan Note (Signed)
Doing well. F/u in one week.

## 2012-02-13 ENCOUNTER — Encounter: Payer: Medicaid Other | Admitting: Family Medicine

## 2012-02-19 ENCOUNTER — Ambulatory Visit (INDEPENDENT_AMBULATORY_CARE_PROVIDER_SITE_OTHER): Payer: Medicaid Other | Admitting: Family Medicine

## 2012-02-19 ENCOUNTER — Other Ambulatory Visit (HOSPITAL_COMMUNITY)
Admission: RE | Admit: 2012-02-19 | Discharge: 2012-02-19 | Disposition: A | Discharge status: Home / Self Care | Source: Ambulatory Visit | Attending: Family Medicine | Admitting: Family Medicine

## 2012-02-19 VITALS — BP 115/75 | Temp 98.3°F | Wt 195.0 lb

## 2012-02-19 DIAGNOSIS — Z113 Encounter for screening for infections with a predominantly sexual mode of transmission: Secondary | ICD-10-CM | POA: Insufficient documentation

## 2012-02-19 DIAGNOSIS — Z3480 Encounter for supervision of other normal pregnancy, unspecified trimester: Secondary | ICD-10-CM

## 2012-02-19 DIAGNOSIS — Z3490 Encounter for supervision of normal pregnancy, unspecified, unspecified trimester: Secondary | ICD-10-CM

## 2012-02-19 NOTE — Patient Instructions (Addendum)
Thank you for coming in today You and the baby are doing well Please follow up in clinic in 1 week Pregnancy - Third Trimester The third trimester of pregnancy (the last 3 months) is a period of the most rapid growth for you and your baby. The baby approaches a length of 20 inches and a weight of 6 to 10 pounds. The baby is adding on fat and getting ready for life outside your body. While inside, babies have periods of sleeping and waking, suck their thumbs, and hiccups. You can often feel small contractions of the uterus. This is false labor. It is also called Braxton-Hicks contractions. This is like a practice for labor. The usual problems in this stage of pregnancy include more difficulty breathing, swelling of the hands and feet from water retention, and having to urinate more often because of the uterus and baby pressing on your bladder.  PRENATAL EXAMS  Blood work may continue to be done during prenatal exams. These tests are done to check on your health and the probable health of your baby. Blood work is used to follow your blood levels (hemoglobin). Anemia (low hemoglobin) is common during pregnancy. Iron and vitamins are given to help prevent this. You may also continue to be checked for diabetes. Some of the past blood tests may be done again.  The size of the uterus is measured during each visit. This makes sure your baby is growing properly according to your pregnancy dates.  Your blood pressure is checked every prenatal visit. This is to make sure you are not getting toxemia.  Your urine is checked every prenatal visit for infection, diabetes and protein.  Your weight is checked at each visit. This is done to make sure gains are happening at the suggested rate and that you and your baby are growing normally.  Sometimes, an ultrasound is performed to confirm the position and the proper growth and development of the baby. This is a test done that bounces harmless sound waves off the baby  so your caregiver can more accurately determine due dates.  Discuss the type of pain medication and anesthesia you will have during your labor and delivery.  Discuss the possibility and anesthesia if a Cesarean Section might be necessary.  Inform your caregiver if there is any mental or physical violence at home. Sometimes, a specialized non-stress test, contraction stress test and biophysical profile are done to make sure the baby is not having a problem. Checking the amniotic fluid surrounding the baby is called an amniocentesis. The amniotic fluid is removed by sticking a needle into the belly (abdomen). This is sometimes done near the end of pregnancy if an early delivery is required. In this case, it is done to help make sure the baby's lungs are mature enough for the baby to live outside of the womb. If the lungs are not mature and it is unsafe to deliver the baby, an injection of cortisone medication is given to the mother 1 to 2 days before the delivery. This helps the baby's lungs mature and makes it safer to deliver the baby. CHANGES OCCURING IN THE THIRD TRIMESTER OF PREGNANCY Your body goes through many changes during pregnancy. They vary from person to person. Talk to your caregiver about changes you notice and are concerned about.  During the last trimester, you have probably had an increase in your appetite. It is normal to have cravings for certain foods. This varies from person to person and pregnancy to pregnancy.  You may begin to get stretch marks on your hips, abdomen, and breasts. These are normal changes in the body during pregnancy. There are no exercises or medications to take which prevent this change.  Constipation may be treated with a stool softener or adding bulk to your diet. Drinking lots of fluids, fiber in vegetables, fruits, and whole grains are helpful.  Exercising is also helpful. If you have been very active up until your pregnancy, most of these activities can  be continued during your pregnancy. If you have been less active, it is helpful to start an exercise program such as walking. Consult your caregiver before starting exercise programs.  Avoid all smoking, alcohol, un-prescribed drugs, herbs and "street drugs" during your pregnancy. These chemicals affect the formation and growth of the baby. Avoid chemicals throughout the pregnancy to ensure the delivery of a healthy infant.  Backache, varicose veins and hemorrhoids may develop or get worse.  You will tire more easily in the third trimester, which is normal.  The baby's movements may be stronger and more often.  You may become short of breath easily.  Your belly button may stick out.  A yellow discharge may leak from your breasts called colostrum.  You may have a bloody mucus discharge. This usually occurs a few days to a week before labor begins. HOME CARE INSTRUCTIONS   Keep your caregiver's appointments. Follow your caregiver's instructions regarding medication use, exercise, and diet.  During pregnancy, you are providing food for you and your baby. Continue to eat regular, well-balanced meals. Choose foods such as meat, fish, milk and other low fat dairy products, vegetables, fruits, and whole-grain breads and cereals. Your caregiver will tell you of the ideal weight gain.  A physical sexual relationship may be continued throughout pregnancy if there are no other problems such as early (premature) leaking of amniotic fluid from the membranes, vaginal bleeding, or belly (abdominal) pain.  Exercise regularly if there are no restrictions. Check with your caregiver if you are unsure of the safety of your exercises. Greater weight gain will occur in the last 2 trimesters of pregnancy. Exercising helps:  Control your weight.  Get you in shape for labor and delivery.  You lose weight after you deliver.  Rest a lot with legs elevated, or as needed for leg cramps or low back pain.  Wear  a good support or jogging bra for breast tenderness during pregnancy. This may help if worn during sleep. Pads or tissues may be used in the bra if you are leaking colostrum.  Do not use hot tubs, steam rooms, or saunas.  Wear your seat belt when driving. This protects you and your baby if you are in an accident.  Avoid raw meat, cat litter boxes and soil used by cats. These carry germs that can cause birth defects in the baby.  It is easier to loose urine during pregnancy. Tightening up and strengthening the pelvic muscles will help with this problem. You can practice stopping your urination while you are going to the bathroom. These are the same muscles you need to strengthen. It is also the muscles you would use if you were trying to stop from passing gas. You can practice tightening these muscles up 10 times a set and repeating this about 3 times per day. Once you know what muscles to tighten up, do not perform these exercises during urination. It is more likely to cause an infection by backing up the urine.  Ask for help  if you have financial, counseling or nutritional needs during pregnancy. Your caregiver will be able to offer counseling for these needs as well as refer you for other special needs.  Make a list of emergency phone numbers and have them available.  Plan on getting help from family or friends when you go home from the hospital.  Make a trial run to the hospital.  Take prenatal classes with the father to understand, practice and ask questions about the labor and delivery.  Prepare the baby's room/nursery.  Do not travel out of the city unless it is absolutely necessary and with the advice of your caregiver.  Wear only low or no heal shoes to have better balance and prevent falling. MEDICATIONS AND DRUG USE IN PREGNANCY  Take prenatal vitamins as directed. The vitamin should contain 1 milligram of folic acid. Keep all vitamins out of reach of children. Only a couple  vitamins or tablets containing iron may be fatal to a baby or young child when ingested.  Avoid use of all medications, including herbs, over-the-counter medications, not prescribed or suggested by your caregiver. Only take over-the-counter or prescription medicines for pain, discomfort, or fever as directed by your caregiver. Do not use aspirin, ibuprofen (Motrin, Advil, Nuprin) or naproxen (Aleve) unless OK'd by your caregiver.  Let your caregiver also know about herbs you may be using.  Alcohol is related to a number of birth defects. This includes fetal alcohol syndrome. All alcohol, in any form, should be avoided completely. Smoking will cause low birth rate and premature babies.  Street/illegal drugs are very harmful to the baby. They are absolutely forbidden. A baby born to an addicted mother will be addicted at birth. The baby will go through the same withdrawal an adult does. SEEK MEDICAL CARE IF: You have any concerns or worries during your pregnancy. It is better to call with your questions if you feel they cannot wait, rather than worry about them. DECISIONS ABOUT CIRCUMCISION You may or may not know the sex of your baby. If you know your baby is a boy, it may be time to think about circumcision. Circumcision is the removal of the foreskin of the penis. This is the skin that covers the sensitive end of the penis. There is no proven medical need for this. Often this decision is made on what is popular at the time or based upon religious beliefs and social issues. You can discuss these issues with your caregiver or pediatrician. SEEK IMMEDIATE MEDICAL CARE IF:   An unexplained oral temperature above 102 F (38.9 C) develops, or as your caregiver suggests.  You have leaking of fluid from the vagina (birth canal). If leaking membranes are suspected, take your temperature and tell your caregiver of this when you call.  There is vaginal spotting, bleeding or passing clots. Tell your  caregiver of the amount and how many pads are used.  You develop a bad smelling vaginal discharge with a change in the color from clear to white.  You develop vomiting that lasts more than 24 hours.  You develop chills or fever.  You develop shortness of breath.  You develop burning on urination.  You loose more than 2 pounds of weight or gain more than 2 pounds of weight or as suggested by your caregiver.  You notice sudden swelling of your face, hands, and feet or legs.  You develop belly (abdominal) pain. Round ligament discomfort is a common non-cancerous (benign) cause of abdominal pain in pregnancy. Your  caregiver still must evaluate you.  You develop a severe headache that does not go away.  You develop visual problems, blurred or double vision.  If you have not felt your baby move for more than 1 hour. If you think the baby is not moving as much as usual, eat something with sugar in it and lie down on your left side for an hour. The baby should move at least 4 to 5 times per hour. Call right away if your baby moves less than that.  You fall, are in a car accident or any kind of trauma.  There is mental or physical violence at home. Document Released: 02/07/2001 Document Revised: 05/08/2011 Document Reviewed: 08/12/2008 [facility] Patient Information 2013 [place], [place].  Fetal Movement Counts Patient Name: __________________________________________________ Patient Due Date: ____________________ Melody Haver counts is highly recommended in high risk pregnancies, but it is a good idea for every pregnant woman to do. Start counting fetal movements at 28 weeks of the pregnancy. Fetal movements increase after eating a full meal or eating or drinking something sweet (the blood sugar is higher). It is also important to drink plenty of fluids (well hydrated) before doing the count. Lie on your left side because it helps with the circulation or you can sit in a comfortable chair with your arms  over your belly (abdomen) with no distractions around you. DOING THE COUNT  Try to do the count the same time of day each time you do it.  Mark the day and time, then see how long it takes for you to feel 10 movements (kicks, flutters, swishes, rolls). You should have at least 10 movements within 2 hours. You will most likely feel 10 movements in much less than 2 hours. If you do not, wait an hour and count again. After a couple of days you will see a pattern.  What you are looking for is a change in the pattern or not enough counts in 2 hours. Is it taking longer in time to reach 10 movements? SEEK MEDICAL CARE IF:  You feel less than 10 counts in 2 hours. Tried twice.  No movement in one hour.  The pattern is changing or taking longer each day to reach 10 counts in 2 hours.  You feel the baby is not moving as it usually does. Date: ____________ Movements: ____________ Start time: ____________ [name] time: ____________  Date: ____________ Movements: ____________ Start time: ____________ [name] time: ____________ Date: ____________ Movements: ____________ Start time: ____________ [name] time: ____________ Date: ____________ Movements: ____________ Start time: ____________ [name] time: ____________ Date: ____________ Movements: ____________ Start time: ____________ [name] time: ____________ Date: ____________ Movements: ____________ Start time: ____________ [name] time: ____________ Date: ____________ Movements: ____________ Start time: ____________ [name] time: ____________ Date: ____________ Movements: ____________ Start time: ____________ [name] time: ____________  Date: ____________ Movements: ____________ Start time: ____________ [name] time: ____________ Date: ____________ Movements: ____________ Start time: ____________ [name] time: ____________ Date: ____________ Movements: ____________ Start time: ____________ [name] time: ____________ Date: ____________ Movements: ____________  Start time: ____________ [name] time: ____________ Date: ____________ Movements: ____________ Start time: ____________ [name] time: ____________ Date: ____________ Movements: ____________ Start time: ____________ [name] time: ____________ Date: ____________ Movements: ____________ Start time: ____________ [name] time: ____________  Date: ____________ Movements: ____________ Start time: ____________ [name] time: ____________ Date: ____________ Movements: ____________ Start time: ____________ [name] time: ____________ Date: ____________ Movements: ____________ Start time: ____________ [name] time: ____________ Date: ____________ Movements: ____________ Start time: ____________ [name] time: ____________ Date: ____________ Movements: ____________ Start time: ____________ [name]  time: ____________ Date: ____________ Movements: ____________ Start time: ____________ [name] time: ____________ Date: ____________ Movements: ____________ Start time: ____________ [name] time: ____________  Date: ____________ Movements: ____________ Start time: ____________ [name] time: ____________ Date: ____________ Movements: ____________ Start time: ____________ [name] time: ____________ Date: ____________ Movements: ____________ Start time: ____________ [name] time: ____________ Date: ____________ Movements: ____________ Start time: ____________ [name] time: ____________ Date: ____________ Movements: ____________ Start time: ____________ [name] time: ____________ Date: ____________ Movements: ____________ Start time: ____________ [name] time: ____________ Date: ____________ Movements: ____________ Start time: ____________ [name] time: ____________  Date: ____________ Movements: ____________ Start time: ____________ [name] time: ____________ Date: ____________ Movements: ____________ Start time: ____________ [name] time: ____________ Date: ____________ Movements: ____________ Start time: ____________ [name] time:  ____________ Date: ____________ Movements: ____________ Start time: ____________ [name] time: ____________ Date: ____________ Movements: ____________ Start time: ____________ [name] time: ____________ Date: ____________ Movements: ____________ Start time: ____________ [name] time: ____________ Date: ____________ Movements: ____________ Start time: ____________ [name] time: ____________  Date: ____________ Movements: ____________ Start time: ____________ [name] time: ____________ Date: ____________ Movements: ____________ Start time: ____________ [name] time: ____________ Date: ____________ Movements: ____________ Start time: ____________ [name] time: ____________ Date: ____________ Movements: ____________ Start time: ____________ [name] time: ____________ Date: ____________ Movements: ____________ Start time: ____________ [name] time: ____________ Date: ____________ Movements: ____________ Start time: ____________ [name] time: ____________ Date: ____________ Movements: ____________ Start time: ____________ [name] time: ____________  Date: ____________ Movements: ____________ Start time: ____________ [name] time: ____________ Date: ____________ Movements: ____________ Start time: ____________ [name] time: ____________ Date: ____________ Movements: ____________ Start time: ____________ [name] time: ____________ Date: ____________ Movements: ____________ Start time: ____________ [name] time: ____________ Date: ____________ Movements: ____________ Start time: ____________ [name] time: ____________ Date: ____________ Movements: ____________ Start time: ____________ [name] time: ____________ Date: ____________ Movements: ____________ Start time: ____________ [name] time: ____________  Date: ____________ Movements: ____________ Start time: ____________ [name] time: ____________ Date: ____________ Movements: ____________ Start time: ____________ [name] time: ____________ Date: ____________ Movements:  ____________ Start time: ____________ [name] time: ____________ Date: ____________ Movements: ____________ Start time: ____________ [name] time: ____________ Date: ____________ Movements: ____________ Start time: ____________ [name] time: ____________ Date: ____________ Movements: ____________ Start time: ____________ [name] time: ____________ Document Released: 03/15/2006 Document Revised: 05/08/2011 Document Reviewed: 09/15/2008 [employer] Patient Information 2013 [employer].

## 2012-02-21 LAB — STREP B DNA PROBE: GBSP: NEGATIVE

## 2012-02-26 ENCOUNTER — Ambulatory Visit (INDEPENDENT_AMBULATORY_CARE_PROVIDER_SITE_OTHER): Payer: Medicaid Other | Admitting: Family Medicine

## 2012-02-26 ENCOUNTER — Telehealth: Payer: Self-pay | Admitting: Family Medicine

## 2012-02-26 VITALS — BP 117/80 | Wt 195.0 lb

## 2012-02-26 DIAGNOSIS — Z3490 Encounter for supervision of normal pregnancy, unspecified, unspecified trimester: Secondary | ICD-10-CM

## 2012-02-26 DIAGNOSIS — Z3480 Encounter for supervision of other normal pregnancy, unspecified trimester: Secondary | ICD-10-CM

## 2012-02-26 NOTE — Progress Notes (Signed)
[name] is presenting today for her routine prenatal care.  No complaints today other than wanting to have her baby and being tired of being pregnant Irregular contractions Not taking a PNV No regular contractions  Pt is a  [id number] at 38wks No signs of active labor but cervix is dilating - GBS, GC/CHl today Labor precautions reviewed. Planning on tubal and papers in chart F/u in 1wk w/ PCP

## 2012-02-26 NOTE — Telephone Encounter (Signed)
Scheduled induction of labor for 03/11/11 at 7:30 PM patient to present to [facility].

## 2012-02-26 NOTE — Progress Notes (Signed)
[name] comes in for routine OB follow up.  She has no complaints, reports good fetal movement, no discharge or bleeding.  Says she is ready to have the baby.  36 year old [id number] @ 39 weeks:  - GBS Neg - Plans on tubal if she gets epidural/needs c-section (papers signed), unsure about contraception if does not get epidural - Plans to bottle feed - Labor precautions and fetal kick counts reviewed.  - Discussed plan if she goes post-term (induction around 41 weeks), discussed postnatal care - f/u in one week.

## 2012-02-26 NOTE — Patient Instructions (Signed)
It was nice to meet you.  You are 39 weeks today- your due date is ine one week.  Please make an appointment to see Dr. Armen Pickup in one week.  If you think you are in labor, think your water has broken, or you have vaginal bleeding, or you do not think the baby is moving well, go to [facility].

## 2012-02-27 ENCOUNTER — Encounter (HOSPITAL_COMMUNITY): Payer: Self-pay | Admitting: Anesthesiology

## 2012-02-27 ENCOUNTER — Inpatient Hospital Stay (HOSPITAL_COMMUNITY): Payer: Medicaid Other | Admitting: Anesthesiology

## 2012-02-27 ENCOUNTER — Inpatient Hospital Stay (HOSPITAL_COMMUNITY)
Admission: AD | Admit: 2012-02-27 | Discharge: 2012-02-28 | DRG: 767 | Disposition: A | Discharge status: Home / Self Care | Payer: Medicaid Other | Source: Ambulatory Visit | Attending: Obstetrics and Gynecology | Admitting: Obstetrics and Gynecology

## 2012-02-27 ENCOUNTER — Encounter (HOSPITAL_COMMUNITY)
Admission: AD | Disposition: A | Discharge status: Home / Self Care | Payer: Self-pay | Source: Ambulatory Visit | Attending: Obstetrics and Gynecology

## 2012-02-27 ENCOUNTER — Encounter (HOSPITAL_COMMUNITY): Payer: Self-pay

## 2012-02-27 DIAGNOSIS — IMO0001 Reserved for inherently not codable concepts without codable children: Secondary | ICD-10-CM

## 2012-02-27 DIAGNOSIS — Z3490 Encounter for supervision of normal pregnancy, unspecified, unspecified trimester: Secondary | ICD-10-CM

## 2012-02-27 DIAGNOSIS — Z302 Encounter for sterilization: Secondary | ICD-10-CM

## 2012-02-27 HISTORY — PX: TUBAL LIGATION: SHX77

## 2012-02-27 LAB — TYPE AND SCREEN
ABO/RH(D): O POS
Antibody Screen: NEGATIVE

## 2012-02-27 LAB — CBC
HCT: 36.2 % (ref 36.0–46.0)
Hemoglobin: 12 g/dL (ref 12.0–15.0)
MCH: 28 pg (ref 26.0–34.0)
MCHC: 33.1 g/dL (ref 30.0–36.0)
MCV: 84.4 fL (ref 78.0–100.0)
Platelets: 262 10*3/uL (ref 150–400)
RBC: 4.29 MIL/uL (ref 3.87–5.11)
RDW: 14.5 % (ref 11.5–15.5)
WBC: 7.8 10*3/uL (ref 4.0–10.5)

## 2012-02-27 LAB — ABO/RH: ABO/RH(D): O POS

## 2012-02-27 LAB — RPR: RPR Ser Ql: NONREACTIVE

## 2012-02-27 SURGERY — LIGATION, FALLOPIAN TUBE, POSTPARTUM
Anesthesia: Epidural | Site: Abdomen | Laterality: Bilateral | Wound class: Clean

## 2012-02-27 MED ORDER — CITRIC ACID-SODIUM CITRATE 334-500 MG/5ML PO SOLN
30.0000 mL | ORAL | Status: DC | PRN
  Administered 2012-02-27: 30 mL via ORAL
  Filled 2012-02-27: qty 15

## 2012-02-27 MED ORDER — LIDOCAINE-EPINEPHRINE (PF) 2 %-1:200000 IJ SOLN
INTRAMUSCULAR | Status: AC
  Filled 2012-02-27: qty 20

## 2012-02-27 MED ORDER — SODIUM BICARBONATE 8.4 % IV SOLN
INTRAVENOUS | Status: DC | PRN
  Administered 2012-02-27: 2 mL via EPIDURAL

## 2012-02-27 MED ORDER — SENNOSIDES-DOCUSATE SODIUM 8.6-50 MG PO TABS
2.0000 | ORAL_TABLET | Freq: Every day | ORAL | Status: DC
  Administered 2012-02-27: 2 via ORAL

## 2012-02-27 MED ORDER — EPHEDRINE 5 MG/ML INJ
10.0000 mg | INTRAVENOUS | Status: DC | PRN
  Filled 2012-02-27: qty 4

## 2012-02-27 MED ORDER — PHENYLEPHRINE 40 MCG/ML (10ML) SYRINGE FOR IV PUSH (FOR BLOOD PRESSURE SUPPORT): 80.0000 ug | PREFILLED_SYRINGE | INTRAVENOUS | Status: DC | PRN

## 2012-02-27 MED ORDER — BUPIVACAINE HCL (PF) 0.25 % IJ SOLN
INTRAMUSCULAR | Status: DC | PRN
  Administered 2012-02-27: 14 mL

## 2012-02-27 MED ORDER — SODIUM BICARBONATE 8.4 % IV SOLN
INTRAVENOUS | Status: AC
  Filled 2012-02-27: qty 50

## 2012-02-27 MED ORDER — LACTATED RINGERS IV SOLN
500.0000 mL | Freq: Once | INTRAVENOUS | Status: AC
  Administered 2012-02-27: 500 mL via INTRAVENOUS

## 2012-02-27 MED ORDER — DIPHENHYDRAMINE HCL 25 MG PO CAPS: 25.0000 mg | ORAL_CAPSULE | Freq: Four times a day (QID) | ORAL | Status: DC | PRN

## 2012-02-27 MED ORDER — FENTANYL CITRATE 0.05 MG/ML IJ SOLN: 25.0000 ug | INTRAMUSCULAR | Status: DC | PRN

## 2012-02-27 MED ORDER — LACTATED RINGERS IV SOLN: 500.0000 mL | INTRAVENOUS | Status: DC | PRN

## 2012-02-27 MED ORDER — TETANUS-DIPHTH-ACELL PERTUSSIS 5-2.5-18.5 LF-MCG/0.5 IM SUSP: 0.5000 mL | Freq: Once | INTRAMUSCULAR | Status: DC

## 2012-02-27 MED ORDER — FENTANYL CITRATE 0.05 MG/ML IJ SOLN
INTRAMUSCULAR | Status: DC | PRN
  Administered 2012-02-27 (×2): 50 ug via INTRAVENOUS

## 2012-02-27 MED ORDER — ZOLPIDEM TARTRATE 5 MG PO TABS: 5.0000 mg | ORAL_TABLET | Freq: Every evening | ORAL | Status: DC | PRN

## 2012-02-27 MED ORDER — LACTATED RINGERS IV SOLN: INTRAVENOUS | Status: DC

## 2012-02-27 MED ORDER — BENZOCAINE-MENTHOL 20-0.5 % EX AERO: 1.0000 "application " | INHALATION_SPRAY | CUTANEOUS | Status: DC | PRN

## 2012-02-27 MED ORDER — SIMETHICONE 80 MG PO CHEW: 80.0000 mg | CHEWABLE_TABLET | ORAL | Status: DC | PRN

## 2012-02-27 MED ORDER — LANOLIN HYDROUS EX OINT: TOPICAL_OINTMENT | CUTANEOUS | Status: DC | PRN

## 2012-02-27 MED ORDER — OXYTOCIN 40 UNITS IN LACTATED RINGERS INFUSION - SIMPLE MED
62.5000 mL/h | INTRAVENOUS | Status: DC
  Administered 2012-02-27: 62.5 mL/h via INTRAVENOUS
  Filled 2012-02-27: qty 1000

## 2012-02-27 MED ORDER — LACTATED RINGERS IV SOLN
INTRAVENOUS | Status: DC | PRN
  Administered 2012-02-27 (×2): via INTRAVENOUS

## 2012-02-27 MED ORDER — LIDOCAINE HCL (PF) 1 % IJ SOLN
INTRAMUSCULAR | Status: DC | PRN
  Administered 2012-02-27 (×2): 5 mL

## 2012-02-27 MED ORDER — FENTANYL 2.5 MCG/ML BUPIVACAINE 1/10 % EPIDURAL INFUSION (WH - ANES)
14.0000 mL/h | INTRAMUSCULAR | Status: DC
  Administered 2012-02-27: 14 mL/h via EPIDURAL
  Filled 2012-02-27: qty 125

## 2012-02-27 MED ORDER — ONDANSETRON HCL 4 MG/2ML IJ SOLN
INTRAMUSCULAR | Status: DC | PRN
  Administered 2012-02-27: 4 mg via INTRAVENOUS

## 2012-02-27 MED ORDER — ACETAMINOPHEN 325 MG PO TABS: 650.0000 mg | ORAL_TABLET | ORAL | Status: DC | PRN

## 2012-02-27 MED ORDER — LACTATED RINGERS IV SOLN
INTRAVENOUS | Status: DC | PRN
  Administered 2012-02-27: 10:00:00 via INTRAVENOUS

## 2012-02-27 MED ORDER — MIDAZOLAM HCL 2 MG/2ML IJ SOLN
INTRAMUSCULAR | Status: AC
  Filled 2012-02-27: qty 2

## 2012-02-27 MED ORDER — WITCH HAZEL-GLYCERIN EX PADS: 1.0000 "application " | MEDICATED_PAD | CUTANEOUS | Status: DC | PRN

## 2012-02-27 MED ORDER — BUPIVACAINE HCL (PF) 0.25 % IJ SOLN
INTRAMUSCULAR | Status: AC
  Filled 2012-02-27: qty 30

## 2012-02-27 MED ORDER — DIBUCAINE 1 % RE OINT: 1.0000 "application " | TOPICAL_OINTMENT | RECTAL | Status: DC | PRN

## 2012-02-27 MED ORDER — FENTANYL CITRATE 0.05 MG/ML IJ SOLN
INTRAMUSCULAR | Status: AC
  Filled 2012-02-27: qty 2

## 2012-02-27 MED ORDER — IBUPROFEN 600 MG PO TABS: 600.0000 mg | ORAL_TABLET | Freq: Four times a day (QID) | ORAL | Status: DC | PRN

## 2012-02-27 MED ORDER — FLEET ENEMA 7-19 GM/118ML RE ENEM: 1.0000 | ENEMA | RECTAL | Status: DC | PRN

## 2012-02-27 MED ORDER — DIPHENHYDRAMINE HCL 50 MG/ML IJ SOLN: 12.5000 mg | INTRAMUSCULAR | Status: DC | PRN

## 2012-02-27 MED ORDER — EPHEDRINE 5 MG/ML INJ: 10.0000 mg | INTRAVENOUS | Status: DC | PRN

## 2012-02-27 MED ORDER — ONDANSETRON HCL 4 MG/2ML IJ SOLN: 4.0000 mg | INTRAMUSCULAR | Status: DC | PRN

## 2012-02-27 MED ORDER — MIDAZOLAM HCL 5 MG/5ML IJ SOLN
INTRAMUSCULAR | Status: DC | PRN
  Administered 2012-02-27: 2 mg via INTRAVENOUS

## 2012-02-27 MED ORDER — LIDOCAINE HCL (PF) 1 % IJ SOLN
30.0000 mL | INTRAMUSCULAR | Status: DC | PRN
  Filled 2012-02-27: qty 30

## 2012-02-27 MED ORDER — OXYCODONE-ACETAMINOPHEN 5-325 MG PO TABS: 1.0000 | ORAL_TABLET | ORAL | Status: DC | PRN

## 2012-02-27 MED ORDER — ONDANSETRON HCL 4 MG PO TABS: 4.0000 mg | ORAL_TABLET | ORAL | Status: DC | PRN

## 2012-02-27 MED ORDER — OXYTOCIN BOLUS FROM INFUSION
500.0000 mL | INTRAVENOUS | Status: DC
  Administered 2012-02-27: 500 mL via INTRAVENOUS

## 2012-02-27 MED ORDER — PHENYLEPHRINE 40 MCG/ML (10ML) SYRINGE FOR IV PUSH (FOR BLOOD PRESSURE SUPPORT)
80.0000 ug | PREFILLED_SYRINGE | INTRAVENOUS | Status: DC | PRN
  Filled 2012-02-27: qty 5

## 2012-02-27 MED ORDER — PRENATAL MULTIVITAMIN CH: 1.0000 | ORAL_TABLET | Freq: Every day | ORAL | Status: DC

## 2012-02-27 MED ORDER — 0.9 % SODIUM CHLORIDE (POUR BTL) OPTIME
TOPICAL | Status: DC | PRN
  Administered 2012-02-27: 1000 mL

## 2012-02-27 MED ORDER — ONDANSETRON HCL 4 MG/2ML IJ SOLN
INTRAMUSCULAR | Status: AC
  Filled 2012-02-27: qty 2

## 2012-02-27 MED ORDER — OXYCODONE-ACETAMINOPHEN 5-325 MG PO TABS
1.0000 | ORAL_TABLET | ORAL | Status: DC | PRN
  Administered 2012-02-27: 0.5 via ORAL
  Filled 2012-02-27: qty 1

## 2012-02-27 MED ORDER — IBUPROFEN 600 MG PO TABS
600.0000 mg | ORAL_TABLET | Freq: Four times a day (QID) | ORAL | Status: DC
  Administered 2012-02-27 – 2012-02-28 (×3): 600 mg via ORAL
  Filled 2012-02-27 (×3): qty 1

## 2012-02-27 MED ORDER — ONDANSETRON HCL 4 MG/2ML IJ SOLN
4.0000 mg | Freq: Four times a day (QID) | INTRAMUSCULAR | Status: DC | PRN
  Administered 2012-02-27: 4 mg via INTRAVENOUS
  Filled 2012-02-27: qty 2

## 2012-02-27 MED ORDER — FENTANYL CITRATE 0.05 MG/ML IJ SOLN: 100.0000 ug | INTRAMUSCULAR | Status: DC | PRN

## 2012-02-27 SURGICAL SUPPLY — 21 items
BLADE SURG 11 STRL SS (BLADE) ×2 IMPLANT
CHLORAPREP W/TINT 26ML (MISCELLANEOUS) ×2 IMPLANT
CLIP FILSHIE TUBAL LIGA STRL (Clip) ×2 IMPLANT
CLOTH BEACON ORANGE TIMEOUT ST (SAFETY) ×2 IMPLANT
DRSG COVADERM PLUS 2X2 (GAUZE/BANDAGES/DRESSINGS) ×2 IMPLANT
GLOVE BIO SURGEON STRL SZ 6.5 (GLOVE) ×4 IMPLANT
GLOVE BIOGEL PI IND STRL 7.0 (GLOVE) ×2 IMPLANT
GLOVE BIOGEL PI INDICATOR 7.0 (GLOVE) ×2
GOWN PREVENTION PLUS LG XLONG (DISPOSABLE) ×6 IMPLANT
NEEDLE HYPO 25X1 1.5 SAFETY (NEEDLE) ×2 IMPLANT
NS IRRIG 1000ML POUR BTL (IV SOLUTION) ×2 IMPLANT
PACK ABDOMINAL GYN (CUSTOM PROCEDURE TRAY) ×2 IMPLANT
PACK ABDOMINAL MINOR (CUSTOM PROCEDURE TRAY) IMPLANT
SPONGE LAP 4X18 X RAY DECT (DISPOSABLE) IMPLANT
SUT VIC AB 0 CT1 27 (SUTURE) ×1
SUT VIC AB 0 CT1 27XBRD ANBCTR (SUTURE) ×1 IMPLANT
SUT VICRYL 4-0 PS2 18IN ABS (SUTURE) ×2 IMPLANT
SYR CONTROL 10ML LL (SYRINGE) ×2 IMPLANT
TOWEL OR 17X24 6PK STRL BLUE (TOWEL DISPOSABLE) ×4 IMPLANT
TRAY FOLEY BAG SILVER LF 14FR (CATHETERS) ×2 IMPLANT
WATER STERILE IRR 1000ML POUR (IV SOLUTION) IMPLANT

## 2012-02-27 NOTE — Transfer of Care (Signed)
Immediate Anesthesia Transfer of Care Note  Patient: [name]  Procedure(s) Performed: Procedure(s) (LRB) with comments: POST PARTUM TUBAL LIGATION (Bilateral)  Patient Location: PACU  Anesthesia Type:Epidural  Level of Consciousness: awake, alert , oriented and patient cooperative  Airway & Oxygen Therapy: Patient Spontanous Breathing  Post-op Assessment: Report given to PACU RN and Post -op Vital signs reviewed and stable  Post vital signs: Reviewed and stable  Complications: No apparent anesthesia complications

## 2012-02-27 NOTE — Progress Notes (Signed)
Patient ID: [name], female   DOB: 1975/10/20, 35 y.o.   MRN: [id number] S/P SVD with no complication, requests sterilization. BTL papers signed. The procedure and the risk of failure and ectopic pregnancy and anesthesia,bleeding, infection, bowel and urinary tract damage and pain were discussed and her questions were answered. Posted for this morning.  Lorece Keach 02/27/2012 8:55 AM

## 2012-02-27 NOTE — Anesthesia Procedure Notes (Signed)
Epidural Patient location during procedure: OB Start time: 02/27/2012 6:07 AM  Staffing Anesthesiologist: Angus Seller., Harrell Gave. Performed by: anesthesiologist   Preanesthetic Checklist Completed: patient identified, site marked, surgical consent, pre-op evaluation, timeout performed, IV checked, risks and benefits discussed and monitors and equipment checked  Epidural Patient position: sitting Prep: site prepped and draped and DuraPrep Patient monitoring: continuous pulse ox and blood pressure Approach: midline Injection technique: LOR air and LOR saline  Needle:  Needle type: Tuohy  Needle gauge: 17 G Needle length: 9 cm and 9 Needle insertion depth: 5 cm cm Catheter type: closed end flexible Catheter size: 19 Gauge Catheter at skin depth: 10 cm Test dose: negative  Assessment Events: blood not aspirated, injection not painful, no injection resistance, negative IV test and no paresthesia  Additional Notes Patient identified.  Risk benefits discussed including failed block, incomplete pain control, headache, nerve damage, paralysis, blood pressure changes, nausea, vomiting, reactions to medication both toxic or allergic, and postpartum back pain.  Patient expressed understanding and wished to proceed.  All questions were answered.  Sterile technique used throughout procedure and epidural site dressed with sterile barrier dressing. No paresthesia or other complications noted.The patient did not experience any signs of intravascular injection such as tinnitus or metallic taste in mouth nor signs of intrathecal spread such as rapid motor block. Please see nursing notes for vital signs.

## 2012-02-27 NOTE — Anesthesia Preprocedure Evaluation (Signed)
Anesthesia Evaluation  Patient identified by MRN, date of birth, ID band Patient awake    Reviewed: Allergy & Precautions, H&P , Patient's Chart, lab work & pertinent test results  Airway Mallampati: II TM Distance: >3 FB Neck ROM: full    Dental No notable dental hx.    Pulmonary neg pulmonary ROS,  breath sounds clear to auscultation  Pulmonary exam normal       Cardiovascular negative cardio ROS  Rhythm:regular Rate:Normal     Neuro/Psych PSYCHIATRIC DISORDERS negative neurological ROS  negative psych ROS   GI/Hepatic negative GI ROS, Neg liver ROS,   Endo/Other  negative endocrine ROS  Renal/GU negative Renal ROS     Musculoskeletal   Abdominal   Peds  Hematology negative hematology ROS (+)   Anesthesia Other Findings   Reproductive/Obstetrics (+) Pregnancy                           Anesthesia Physical  Anesthesia Plan  ASA: II  Anesthesia Plan: Epidural   Post-op Pain Management:    Induction:   Airway Management Planned:   Additional Equipment:   Intra-op Plan:   Post-operative Plan:   Informed Consent: I have reviewed the patients History and Physical, chart, labs and discussed the procedure including the risks, benefits and alternatives for the proposed anesthesia with the patient or authorized representative who has indicated his/her understanding and acceptance.   Dental Advisory Given  Plan Discussed with:   Anesthesia Plan Comments: (Labs checked- platelets confirmed with RN in room. Fetal heart tracing, per RN, reported to be stable enough for sitting procedure. Discussed epidural, and patient consents to the procedure:  included risk of possible headache,backache, failed block, allergic reaction, and nerve injury. This patient was asked if she had any questions or concerns before the procedure started. )       Anesthesia Quick Evaluation

## 2012-02-27 NOTE — Preoperative (Signed)
Beta Blockers   Reason not to administer Beta Blockers:Not Applicable 

## 2012-02-27 NOTE — Anesthesia Preprocedure Evaluation (Signed)
Anesthesia Evaluation  Patient identified by MRN, date of birth, ID band Patient awake    Reviewed: Allergy & Precautions, H&P , Patient's Chart, lab work & pertinent test results  Airway Mallampati: II TM Distance: >3 FB Neck ROM: full    Dental No notable dental hx.    Pulmonary neg pulmonary ROS,  breath sounds clear to auscultation  Pulmonary exam normal       Cardiovascular negative cardio ROS  Rhythm:regular Rate:Normal     Neuro/Psych PSYCHIATRIC DISORDERS negative neurological ROS  negative psych ROS   GI/Hepatic negative GI ROS, Neg liver ROS,   Endo/Other  negative endocrine ROS  Renal/GU negative Renal ROS     Musculoskeletal   Abdominal   Peds  Hematology negative hematology ROS (+)   Anesthesia Other Findings   Reproductive/Obstetrics (+) Pregnancy                           Anesthesia Physical Anesthesia Plan  ASA: II  Anesthesia Plan: Epidural   Post-op Pain Management:    Induction:   Airway Management Planned:   Additional Equipment:   Intra-op Plan:   Post-operative Plan:   Informed Consent: I have reviewed the patients History and Physical, chart, labs and discussed the procedure including the risks, benefits and alternatives for the proposed anesthesia with the patient or authorized representative who has indicated his/her understanding and acceptance.     Plan Discussed with:   Anesthesia Plan Comments:         Anesthesia Quick Evaluation

## 2012-02-27 NOTE — MAU Note (Signed)
Contractions  

## 2012-02-27 NOTE — H&P (Signed)
[name] is a 36 y.o. female [phone] at [redacted]w[redacted]d by LMP consistent with 14w ultrasound, presenting with contractions since around 0300-0330. Contractions are very strong/painful and close together, but pt "has not been keeping track" of how close they are. Feels as though she has to have a BM and has not had one recently in the past 24h; typically has 1 BM a day. Also complains of strong back pain and desires epidural; if pt is able to get epidural before delivery, she desires a BTL and states she has signed paper. However, pt states, "If I don't get the epidural before the baby comes, I don't want one after and if I don't get an epidural I don't want the tubal."  Otherwise denies complaints. No fever/chills, headache/change in vision, N/V, RUQ pain, new/worse swelling.  Prenatal care at [facility], PCP is Dr. Armen Pickup. Pt last seen yesterday by Ardyth Gal. Denies complications with pregnancy. States that previous four deliveries have been vaginal and labors are "typically quick," but that providers have "always had to break her water."  Maternal Medical History:  Reason for admission: Reason for admission: contractions.  Contractions: Onset was 3-5 hours ago.   Frequency: regular.   Perceived severity is strong.    Fetal activity: Perceived fetal activity is normal.   Last perceived fetal movement was within the past hour.    Prenatal complications: no prenatal complications Prenatal Complications - Diabetes: none.    OB History    Grav Para Term Preterm Abortions TAB SAB Ect Mult Living   9 4 4  0 4 4    4      Past Medical History  Diagnosis Date  . UTI (lower urinary tract infection)     history of   . Anxiety    Past Surgical History  Procedure Date  . No past surgeries    Family History: family history includes Diabetes in her mother and Drug abuse in her maternal grandmother. Social History:  reports that she has quit smoking. She has never used smokeless tobacco. She  reports that she does not drink alcohol or use illicit drugs.  ROS  Dilation: 6.5 Effacement (%): 90 Station: -1 Exam by:: Dr Casper Harrison Blood pressure 132/76, pulse 107, temperature 98.2 F (36.8 C), temperature source Oral, resp. rate 18, height 5\' 6"  (1.676 m), weight 88.451 kg (195 lb), last menstrual period 05/29/2011, SpO2 100.00%. Maternal Exam:  Uterine Assessment: Contraction strength is moderate.  Contraction duration is 30 seconds. Poor pick-up of contractions on tocometry  Abdomen: Fetal presentation: vertex  Introitus: Normal vulva. Normal vagina.  Pelvis: adequate for delivery.   Cervix: Cervix evaluated by digital exam.     Fetal Exam Fetal Monitor Review: Mode: ultrasound.   Baseline rate: 120-130.  Variability: moderate (6-25 bpm).   Pattern: accelerations present and no decelerations.    Fetal State Assessment: Category I - tracings are normal.     Physical Exam  Vitals reviewed. Constitutional: She appears well-developed and well-nourished. She appears distressed (very uncomfortable with contractions).  HENT:  Head: Normocephalic and atraumatic.  Eyes: Conjunctivae normal are normal. Pupils are equal, round, and reactive to light.  Neck: Normal range of motion. Neck supple.  Cardiovascular: Normal rate, regular rhythm and normal heart sounds.   No murmur heard. Respiratory: Effort normal and breath sounds normal. She has no wheezes.  GI: Soft. Bowel sounds are normal. There is no tenderness.  Genitourinary: Vagina normal. Pelvic exam was performed with patient prone.  Reported first cervical check by RN in MAU at 5.5 / 90 / -2. Check by Dr. Casper Harrison after arrival in birthing suite 168 below: Dilation: 6.5 Effacement (%): 90 Cervical Position: Middle Station: -1 Presentation: Vertex Exam by:: Dr Casper Harrison  Musculoskeletal: Normal range of motion. She exhibits no edema.  Neurological: She is alert.  Skin: Skin is warm and dry. No erythema.    Psychiatric: She has a normal mood and affect. Her behavior is normal.       Slightly pressured speech but plainly uncomfortable with contractions. Appropriate/cooperative with interview/exam.    Prenatal labs: ABO, Rh: O/POS/-- (07/03 1117) Antibody: NEG (07/03 1117) Rubella: 378.3 (07/03 1117) RPR: NON REAC (10/09 1046)  HBsAg: NEGATIVE (07/03 1117)  HIV: NON REACTIVE (10/09 1046)  GBS: NEGATIVE (12/23 1532)   Assessment/Plan: 36 y.o. [id number] at [redacted]w[redacted]d -SIUP, in active labor  -continuity pt of Dr. Armen Pickup, who has been paged (has not returned page at time of this writing, may be on vacation for New Year's)  -admit to labor and delivery, routine orders  -GBS negative, no prophylaxis  -pt desires epidural; if epidural placed, pt also desires BTL for sterlization  Above discussed with Philipp Deputy, CNM.  Street, Christopher 02/27/2012, 5:55 AM  I have seen and examined this patient and I agree with the above. Cam Hai 8:31 PM 03/01/2012

## 2012-02-27 NOTE — Anesthesia Postprocedure Evaluation (Signed)
  Anesthesia Post-op Note  Patient: [name]  Procedure(s) Performed: Procedure(s) (LRB) with comments: POST PARTUM TUBAL LIGATION (Bilateral)   Patient is awake, responsive, moving her legs, and has signs of resolution of her numbness. Pain and nausea are reasonably well controlled. Vital signs are stable and clinically acceptable. Oxygen saturation is clinically acceptable. There are no apparent anesthetic complications at this time. Patient is ready for discharge.

## 2012-02-27 NOTE — Op Note (Signed)
Procedure: Postpartum bilateral tubal ligation with Filshie clips Preop diagnosis: Undesired fertility Postoperative diagnosis: Tubal sterilization Surgeon: Dr. Scheryl Darter Anesthesia: Epidural and local Complications: None Drains: Foley catheter Specimen: None Counts: Correct   She gave written consent for postpartum bilateral tubal sterilization procedure. Patient identification was confirmed and she was brought the operating room and placed in dorsal supine position. Adequate epidural anesthesia was induced. A Foley catheter was placed. Abdomen was sterilely prepped and draped. Quarter percent Marcaine was infiltrated at the umbilicus. #11 blade was used to make a transverse infraumbilical skin incision approximately 2 cm across. Peritoneum and fascia were entered with Mayo scissors and abdominal cavity was exposed. 2 inch gauze pack was used to pack the bowel and patient placed in Trendelenburg position. The right fallopian tube was identified and elevated traces distal and. Midportion of the tube was drizzled with Marcaine. A Filshie clip was placed at that position and proper placement was assured. Same was done on the left fallopian tube. Both adnexa appeared normal. The packs were removed. The fascia and peritoneum were closed with running suture with 0 Vicryl. Skin was closed with interrupted subcuticular sutures with 4-0 Vicryl. Sterile dressing was applied. Patient tolerated the procedure well and was brought in stable condition to PACU.  Dr. Scheryl Darter 02/27/2012 10:41 AM

## 2012-02-27 NOTE — Progress Notes (Signed)
[name] is a 36 y.o. [phone] at [redacted]w[redacted]d  Subjective: Comfortable with epidural. Some nausea/vomiting; last meal was "15 pieces of [employer] pizza" around 9 PM last night.  Objective: BP 116/70  Pulse 104  Temp 98.2 F (36.8 C) (Oral)  Resp 20  Ht 5\' 6"  (1.676 m)  Wt 88.451 kg (195 lb)  BMI 31.47 kg/m2  SpO2 99%  LMP 05/29/2011      FHT:  FHR: 120-130 bpm, variability: moderate,  accelerations:  Present,  decelerations:  Absent UC:   regular, every 3-5 minutes SVE:   Dilation: Lip/rim Effacement (%): 90 Station: 0 Exam by:: Dr. Casper Harrison  Labs: Lab Results  Component Value Date   WBC 7.8 02/27/2012   HGB 12.0 02/27/2012   HCT 36.2 02/27/2012   MCV 84.4 02/27/2012   PLT 262 02/27/2012    Assessment / Plan: Spontaneous labor, progressing normally AROM 0718, clear fluid. Zofran for nausea.  Labor: Progressing normally Preeclampsia:  no signs or symptoms of toxicity Fetal Wellbeing:  Category I Pain Control:  Epidural I/D:  n/a Anticipated MOD:  NSVD  Cristoval Teall, Cristal Deer 02/27/2012, 7:21 AM

## 2012-02-28 ENCOUNTER — Encounter (HOSPITAL_COMMUNITY): Payer: Self-pay | Admitting: Obstetrics & Gynecology

## 2012-02-28 MED ORDER — IBUPROFEN 600 MG PO TABS: 600.0000 mg | ORAL_TABLET | Freq: Four times a day (QID) | ORAL | Status: DC

## 2012-02-28 MED ORDER — OXYCODONE-ACETAMINOPHEN 5-325 MG PO TABS: 1.0000 | ORAL_TABLET | ORAL | Status: DC | PRN

## 2012-02-28 NOTE — Anesthesia Postprocedure Evaluation (Signed)
Anesthesia Post Note  Patient: [name]  Procedure(s) Performed: Procedure(s) (LRB): POST PARTUM TUBAL LIGATION (Bilateral)  Anesthesia type: Epidural  Patient location: Mother/Baby  Post pain: Pain level controlled  Post assessment: Post-op Vital signs reviewed  Last Vitals:  Filed Vitals:   02/28/12 0751  BP: 95/65  Pulse: 93  Temp: 36.6 C  Resp: 18    Post vital signs: Reviewed  Level of consciousness:alert  Complications: No apparent anesthesia complications

## 2012-02-28 NOTE — Discharge Summary (Signed)
Obstetric Discharge Summary  [name] is a 37 y.o. [phone] presenting at [redacted]w[redacted]d in active labor. She had normal labor and normal vaginal delivery. She had a postpartum bilateral tubal ligation on postpartum day 0. Postpartum course normal. She is bottle feeding.  Reason for Admission: onset of labor Prenatal Procedures: none Intrapartum Procedures: spontaneous vaginal delivery Postpartum Procedures: P.P. tubal ligation Complications-Operative and Postpartum: none Hemoglobin  Date Value Range Status  02/27/2012 12.0  12.0 - 15.0 g/dL Final     HCT  Date Value Range Status  02/27/2012 36.2  36.0 - 46.0 % Final    Physical Exam:  General: alert, cooperative and no distress Lochia: appropriate Uterine Fundus: firm Incision: healing well, no significant drainage, no dehiscence, no significant erythema DVT Evaluation: No evidence of DVT seen on physical exam. Negative Homan's sign. No cords or calf tenderness. No significant calf/ankle edema.  Discharge Diagnoses: Term Pregnancy-delivered and s/p postpartum BTL  Discharge Information: Date: 02/28/2012 Activity: pelvic rest Diet: routine Medications: PNV, Ibuprofen and Percocet Condition: stable Instructions: refer to practice specific booklet Discharge to: home Follow-up Information    Follow up with [facility]. Schedule an appointment as soon as possible for a visit in 4 weeks.   Contact information:   [address] [id number] Wilhe[place] Bon[place] [address] [phone]         Newborn Data: Live born female  Birth Weight: 8 lb 7.8 oz (3850 g) APGAR: 9, 9  Home with mother.  Napoleon Form 02/28/2012, 6:43 AM

## 2012-02-29 ENCOUNTER — Telehealth: Payer: Self-pay | Admitting: Family Medicine

## 2012-02-29 NOTE — Telephone Encounter (Signed)
Patient states she noticed hemmorhoid after pushing to deliver  baby . Area popped out about 1/2 inch. Not painful .  Consulted with Dr. [place]  and she advised   may use Preparation H  and sitz bath to relieve swelling.. Call back if not improving.

## 2012-02-29 NOTE — Telephone Encounter (Signed)
Had baby on Tuesday and now she is having problems with her anus.  She would like to speak to nurse

## 2012-02-29 NOTE — Anesthesia Postprocedure Evaluation (Signed)
Anesthesia Post Note  Patient: [name]  Procedure(s) Performed: * No procedures listed *  Anesthesia type: Epidural  Patient location: Mother/Baby  Post pain: Pain level controlled  Post assessment: Post-op Vital signs reviewed  Last Vitals: There were no vitals filed for this visit.  Post vital signs: Reviewed  Level of consciousness: awake  Complications: No apparent anesthesia complications

## 2012-02-29 NOTE — Telephone Encounter (Signed)
Called patient. Left VM. Congratulated patient on successful delivery. Apologized for missing delivery.

## 2012-02-29 NOTE — Progress Notes (Signed)
Post discharge chart review completed.  

## 2012-02-29 NOTE — Telephone Encounter (Signed)
Attempted call back. No Answer.

## 2012-03-04 NOTE — H&P (Signed)
Attestation of Attending Supervision of Advanced Practitioner (CNM/NP): Evaluation and management procedures were performed by the Advanced Practitioner under my supervision and collaboration.  I have reviewed the Advanced Practitioner's note and chart, and I agree with the management and plan.  Sencere Symonette 03/04/2012 2:42 PM

## 2012-03-07 ENCOUNTER — Encounter: Payer: Self-pay | Admitting: Family Medicine

## 2012-03-08 ENCOUNTER — Encounter: Payer: Medicaid Other | Admitting: Family Medicine

## 2012-03-10 ENCOUNTER — Inpatient Hospital Stay (HOSPITAL_COMMUNITY): Admission: RE | Admit: 2012-03-10 | Payer: Medicaid Other | Source: Ambulatory Visit

## 2012-03-13 ENCOUNTER — Encounter: Payer: Self-pay | Admitting: Family Medicine

## 2012-03-16 NOTE — Discharge Summary (Signed)
Agree with above note.  Jannette Cotham H. 03/16/2012 11:53 AM

## 2012-03-27 ENCOUNTER — Telehealth: Payer: Self-pay | Admitting: Family Medicine

## 2012-03-27 NOTE — Telephone Encounter (Signed)
Needs another note to go back to work with no restrictions - pls fax to [phone] attn: Olean Ree

## 2012-03-27 NOTE — Telephone Encounter (Signed)
Please advise. [name]  

## 2012-03-28 NOTE — Telephone Encounter (Signed)
Note written and placed in to be faxed box.

## 2012-04-03 ENCOUNTER — Ambulatory Visit (INDEPENDENT_AMBULATORY_CARE_PROVIDER_SITE_OTHER): Payer: Medicaid Other | Admitting: Family Medicine

## 2012-04-03 ENCOUNTER — Encounter: Payer: Self-pay | Admitting: Family Medicine

## 2012-04-03 DIAGNOSIS — Z3490 Encounter for supervision of normal pregnancy, unspecified, unspecified trimester: Secondary | ICD-10-CM

## 2012-04-03 DIAGNOSIS — F53 Postpartum depression: Secondary | ICD-10-CM

## 2012-04-03 DIAGNOSIS — O99345 Other mental disorders complicating the puerperium: Secondary | ICD-10-CM

## 2012-04-03 DIAGNOSIS — F329 Major depressive disorder, single episode, unspecified: Secondary | ICD-10-CM

## 2012-04-03 DIAGNOSIS — K5900 Constipation, unspecified: Secondary | ICD-10-CM | POA: Insufficient documentation

## 2012-04-03 MED ORDER — CITALOPRAM HYDROBROMIDE 20 MG PO TABS: 20.0000 mg | ORAL_TABLET | Freq: Every day | ORAL | Status: DC

## 2012-04-03 MED ORDER — POLYETHYLENE GLYCOL 3350 17 GM/SCOOP PO POWD: 17.0000 g | Freq: Every day | ORAL | Status: DC

## 2012-04-03 NOTE — Patient Instructions (Addendum)
[name],  Thank you for coming in to see me today.  You may return to work when you are ready without restrictions.   Please start celexa 20 mg daily for depression. F/u in 3 weeks for re-check. Stop medication and call if thoughts of suicide develop  For constipation: take miralax with 8 oz of water. Start with once daily and increase in 3 days to twice daily as needed to produce one soft stool daily.   Dr. Armen Pickup

## 2012-04-05 NOTE — Assessment & Plan Note (Signed)
A: 5 weeks postpartum. Mild depression and constipation. Depressed about tubal, last baby. Has 6 total.  P: celexa F/u in 3 weeks

## 2012-04-05 NOTE — Progress Notes (Signed)
Patient ID: [name], female   DOB: Jul 07, 1975, 37 y.o.   MRN: [id number] Subjective:     [name] is a 37 y.o. female who presents for a postpartum visit. She is 5 weeks postpartum following a spontaneous vaginal delivery. I have fully reviewed the prenatal and intrapartum course. The delivery was at 39 gestational weeks. Outcome: spontaneous vaginal delivery. Anesthesia: epidural. Postpartum course has been uncomplicated. Baby's course has been uncomplicated. Baby is feeding by bottle - Daron Offer. Bleeding no bleeding. Bowel function is abnormal: constipation. Bladder function is normal. Patient is not sexually active. Contraception method is tubal ligation.   Postpartum depression screening: positive. Patient reports depressed mood about tubal ligation.   Review of Systems Pertinent items are noted in HPI.  Abdominal discomfort: RLQ with constipation.  Not taking percocet.   Objective:    BP 116/76  Pulse 80  Temp 98.8 F (37.1 C) (Oral)  Ht 5\' 6"  (1.676 m)  Wt 182 lb (82.555 kg)  BMI 29.38 kg/m2  General:  alert, cooperative and no distress  Lungs: normal WOB  Heart:  regular rate and rhythm, S1, S2 normal, no murmur, click, rub or gallop  Abdomen: soft, non-tender; bowel sounds normal; no masses,  no organomegaly  Pelvis:  not evaluated        PHQ-9: score 8. 0-6,8,9. 1-7. 2-2,3,4. Somewhat difficult to function. Denies SI and HI. Reports bonding with baby well.  Assessment:    5 postpartum exam. Pap smear not done at today's visit.   Plan:    1. Contraception: tubal ligation 2. Mild post partum depression. Start celexa 20 mg daily as patient also has history of generalized anxiety disorder.  3. Follow up in: 3 weeks or as needed.

## 2012-04-05 NOTE — Assessment & Plan Note (Signed)
miralax per AVS.

## 2012-04-13 ENCOUNTER — Other Ambulatory Visit: Payer: Self-pay

## 2012-04-29 ENCOUNTER — Encounter: Payer: Self-pay | Admitting: Family Medicine

## 2012-04-29 ENCOUNTER — Ambulatory Visit (INDEPENDENT_AMBULATORY_CARE_PROVIDER_SITE_OTHER): Payer: Medicaid Other | Admitting: Family Medicine

## 2012-04-29 VITALS — BP 105/70 | HR 64 | Temp 98.3°F | Ht 66.0 in | Wt 180.0 lb

## 2012-04-29 DIAGNOSIS — F53 Postpartum depression: Secondary | ICD-10-CM

## 2012-04-29 DIAGNOSIS — O99345 Other mental disorders complicating the puerperium: Secondary | ICD-10-CM

## 2012-04-29 DIAGNOSIS — F329 Major depressive disorder, single episode, unspecified: Secondary | ICD-10-CM

## 2012-04-29 DIAGNOSIS — Z6825 Body mass index (BMI) 25.0-25.9, adult: Secondary | ICD-10-CM

## 2012-04-29 DIAGNOSIS — E663 Overweight: Secondary | ICD-10-CM | POA: Insufficient documentation

## 2012-04-29 DIAGNOSIS — F411 Generalized anxiety disorder: Secondary | ICD-10-CM

## 2012-04-29 MED ORDER — ALPRAZOLAM 0.5 MG PO TABS: 0.5000 mg | ORAL_TABLET | Freq: Two times a day (BID) | ORAL | Status: DC | PRN

## 2012-04-29 NOTE — Assessment & Plan Note (Addendum)
A: mild decline. Worsening anxiety with celexa alone. P:   1. Xanax at night as needed  2. celexa and xanax during the day for at least the first 5-7 days of celexa  3. 20-30 mins of exercise most day of the week.  Close f/u in 2 weeks.

## 2012-04-29 NOTE — Patient Instructions (Addendum)
[name],   Thank you for coming in today.  For anxiety/depression:  1. Xanax at night as needed  2. celexa and xanax during the day for at least the first 5-7 days of celexa  3. 20-30 mins of exercise most day of the week.  Close f/u in 2 weeks.  Schedule labs visit next Monday for screening labs.   Dr. Armen Pickup

## 2012-04-29 NOTE — Assessment & Plan Note (Signed)
A: reassurance. Normal abdominal exam. Pains MSK from changes with pregnancy/post op. P: Regular exercise Screening labs: lipids and BMP.

## 2012-04-29 NOTE — Progress Notes (Signed)
Subjective:     Patient ID: [name], female   DOB: 1975/10/07, 37 y.o.   MRN: [id number]  HPI 37 yo F presents for f/u visit to discuss the following:  1. Anxiety: worse with celexa. Patient stopped taking it. Unable to sleep well. Eating one meal per day in the evening. Bonding well with baby Javi. Getting along well with older children. Denies SI/HI. Worried about health and losing baby weight. Would like to be screened for high cholesterol. Not fasting today.   2. Abdominal pains: RUQ and LLQ pains, sharp, short, exacerbated by bending.   Review of Systems As per HPI    Objective:   Physical Exam BP 105/70  Pulse 64  Temp(Src) 98.3 F (36.8 C) (Oral)  Ht 5\' 6"  (1.676 m)  Wt 180 lb (81.647 kg)  BMI 29.07 kg/m2 General appearance: alert, cooperative and no distress Abdomen: soft, non-tender; bowel sounds normal; no masses,  no organomegaly Psych: normal mood and affect.  PHQ-9: score 11, 0-6 and 9. 1-1,2, 7,8. 2-4,5. 3-3. Somewhat difficult to function.  GAD-7 score 1, 1-6. 2-4 and 5. 3-1,2,3 and 7. Somewhat difficult to function.      Assessment and Plan:

## 2012-05-06 ENCOUNTER — Other Ambulatory Visit: Payer: Medicaid Other

## 2012-05-20 ENCOUNTER — Ambulatory Visit (INDEPENDENT_AMBULATORY_CARE_PROVIDER_SITE_OTHER): Payer: Medicaid Other | Admitting: Family Medicine

## 2012-05-20 ENCOUNTER — Encounter: Payer: Self-pay | Admitting: Family Medicine

## 2012-05-20 VITALS — BP 123/72 | HR 63 | Temp 98.1°F | Ht 66.0 in | Wt 179.0 lb

## 2012-05-20 DIAGNOSIS — F329 Major depressive disorder, single episode, unspecified: Secondary | ICD-10-CM

## 2012-05-20 DIAGNOSIS — F411 Generalized anxiety disorder: Secondary | ICD-10-CM

## 2012-05-20 DIAGNOSIS — E663 Overweight: Secondary | ICD-10-CM

## 2012-05-20 DIAGNOSIS — Z6825 Body mass index (BMI) 25.0-25.9, adult: Secondary | ICD-10-CM

## 2012-05-20 DIAGNOSIS — O99345 Other mental disorders complicating the puerperium: Secondary | ICD-10-CM

## 2012-05-20 LAB — COMPREHENSIVE METABOLIC PANEL
ALT: 27 U/L (ref 0–35)
AST: 21 U/L (ref 0–37)
Albumin: 4.2 g/dL (ref 3.5–5.2)
Alkaline Phosphatase: 87 U/L (ref 39–117)
BUN: 9 mg/dL (ref 6–23)
CO2: 26 mEq/L (ref 19–32)
Calcium: 9.2 mg/dL (ref 8.4–10.5)
Chloride: 105 mEq/L (ref 96–112)
Creat: 0.65 mg/dL (ref 0.50–1.10)
Glucose, Bld: 76 mg/dL (ref 70–99)
Potassium: 4.2 mEq/L (ref 3.5–5.3)
Sodium: 139 mEq/L (ref 135–145)
Total Bilirubin: 0.6 mg/dL (ref 0.3–1.2)
Total Protein: 6.9 g/dL (ref 6.0–8.3)

## 2012-05-20 LAB — LIPID PANEL
Cholesterol: 285 mg/dL — ABNORMAL HIGH (ref 0–200)
HDL: 58 mg/dL (ref >39)
LDL Cholesterol: 189 mg/dL — ABNORMAL HIGH (ref 0–99)
Total CHOL/HDL Ratio: 4.9 Ratio
Triglycerides: 192 mg/dL — ABNORMAL HIGH (ref <150)
VLDL: 38 mg/dL (ref 0–40)

## 2012-05-20 MED ORDER — TRAZODONE HCL 50 MG PO TABS: 25.0000 mg | ORAL_TABLET | Freq: Every evening | ORAL | Status: DC | PRN

## 2012-05-20 MED ORDER — CITALOPRAM HYDROBROMIDE 20 MG PO TABS: 20.0000 mg | ORAL_TABLET | Freq: Every day | ORAL | Status: DC

## 2012-05-20 NOTE — Patient Instructions (Signed)
[name],  Thank you for coming in today.  I am concerned about your lack of sleep.  I believe that this is contributing to your poor concentration.   For this:  Continue celexa.  Start trazodone 0.5-1 tab nightly for sleep.   See me in 2 weeks.  Dr. Armen Pickup

## 2012-05-20 NOTE — Assessment & Plan Note (Signed)
A: post partum depression. Now predominantly poor sleep and anxiety. P: Add trazadone to regimen. Continue celexa.  Consider switching to wellbutrin if symptoms do not improve.

## 2012-05-20 NOTE — Progress Notes (Signed)
Subjective:     Patient ID: [name], female   DOB: 1975/10/06, 37 y.o.   MRN: [id number]  HPI  37 yo F presents for f/u visit to discuss the following;  Depression/anxiety: post partum depression. Started on celexa. Now with predominantly features of anxiety. Symptoms included poor sleep, decreased concentration and excessive worry. No plan to hurt herself or others.   Review of Systems As per HPI    Objective:   Physical Exam BP 123/72  Pulse 63  Temp(Src) 98.1 F (36.7 C) (Oral)  Ht 5\' 6"  (1.676 m)  Wt 179 lb (81.194 kg)  BMI 28.91 kg/m2 General appearance: alert, cooperative and no distress    Assessment and Plan:

## 2012-05-20 NOTE — Assessment & Plan Note (Signed)
A: post partum depression. Now predominantly poor sleep and anxiety. P: Add trazadone to regimen. Continue celexa.  Consider switching to wellbutrin if symptoms do not improve.  

## 2012-05-22 ENCOUNTER — Encounter: Payer: Self-pay | Admitting: Family Medicine

## 2012-05-22 ENCOUNTER — Telehealth: Payer: Self-pay | Admitting: Family Medicine

## 2012-05-22 DIAGNOSIS — E785 Hyperlipidemia, unspecified: Secondary | ICD-10-CM | POA: Insufficient documentation

## 2012-05-22 DIAGNOSIS — E781 Pure hyperglyceridemia: Secondary | ICD-10-CM

## 2012-05-22 NOTE — Assessment & Plan Note (Signed)
A: Moderate hypertriglyceridemia.  P: Low carb diet and exercise recommended. Sent letter detailing low carb diet.

## 2012-05-22 NOTE — Telephone Encounter (Signed)
Called patient. Sending letter about lab results. Moderate hypertriglyceridemia.  Low carb diet and exercise recommended. Sent letter detailing low carb diet.

## 2012-06-25 ENCOUNTER — Ambulatory Visit (INDEPENDENT_AMBULATORY_CARE_PROVIDER_SITE_OTHER): Payer: Medicaid Other | Admitting: Family Medicine

## 2012-06-25 ENCOUNTER — Encounter: Payer: Self-pay | Admitting: Family Medicine

## 2012-06-25 VITALS — BP 114/70 | Ht 66.0 in | Wt 174.0 lb

## 2012-06-25 DIAGNOSIS — M549 Dorsalgia, unspecified: Secondary | ICD-10-CM

## 2012-06-25 DIAGNOSIS — F329 Major depressive disorder, single episode, unspecified: Secondary | ICD-10-CM

## 2012-06-25 DIAGNOSIS — L909 Atrophic disorder of skin, unspecified: Secondary | ICD-10-CM

## 2012-06-25 DIAGNOSIS — O99345 Other mental disorders complicating the puerperium: Secondary | ICD-10-CM

## 2012-06-25 DIAGNOSIS — L918 Other hypertrophic disorders of the skin: Secondary | ICD-10-CM | POA: Insufficient documentation

## 2012-06-25 DIAGNOSIS — F53 Postpartum depression: Secondary | ICD-10-CM

## 2012-06-25 DIAGNOSIS — D239 Other benign neoplasm of skin, unspecified: Secondary | ICD-10-CM

## 2012-06-25 DIAGNOSIS — F411 Generalized anxiety disorder: Secondary | ICD-10-CM

## 2012-06-25 DIAGNOSIS — D229 Melanocytic nevi, unspecified: Secondary | ICD-10-CM | POA: Insufficient documentation

## 2012-06-25 MED ORDER — CYCLOBENZAPRINE HCL 10 MG PO TABS: 10.0000 mg | ORAL_TABLET | Freq: Three times a day (TID) | ORAL | Status: DC | PRN

## 2012-06-25 MED ORDER — MELOXICAM 15 MG PO TABS: 15.0000 mg | ORAL_TABLET | Freq: Every day | ORAL | Status: DC

## 2012-06-25 MED ORDER — ALPRAZOLAM 0.5 MG PO TABS: 0.5000 mg | ORAL_TABLET | Freq: Every day | ORAL | Status: DC | PRN

## 2012-06-25 NOTE — Assessment & Plan Note (Signed)
A: improved. Patient would like to stop celexa. P: Taper off celexa Continue trazadone and xanax.  F/u in two weeks.

## 2012-06-25 NOTE — Assessment & Plan Note (Signed)
A: improved.vPatient would like to stop celexa. P: Taper off celexa Continue trazadone and xanax.  F/u in two weeks.

## 2012-06-25 NOTE — Assessment & Plan Note (Signed)
A: suspect muscle strain. No red flags or radicular signs. P: Continue activity/stretching mobic Flexeril Ice  F/u in 2 weeks

## 2012-06-25 NOTE — Assessment & Plan Note (Signed)
A: benign appearing cluster of moles on back. Believe pain is MSK pain. No pain related to mole. P: reassurance.  Will further discuss risk and benefits of mole removal with patient at f/u.

## 2012-06-25 NOTE — Assessment & Plan Note (Signed)
A: painful skin tags. P: removal at f/u.

## 2012-06-25 NOTE — Progress Notes (Signed)
Subjective:     Patient ID: [name], female   DOB: [birth year], 37 y.o.   MRN: [id number]  HPI 37 yo F presents with her two younger children for the following;  1. Anxiety: improved with trazadone and xanax. Thinks celexa is not helping much. Less intrusive thoughts about accidents and danger. No suicidal ideation. Bonding well with 22 month old son [name], also my patient). Still saddened sometimes that he is her last child (s/p tubal ligation).   2. Back pain: x 3 weeks. Started after pulling down trees. Pain is R upper back below scapula, burning, 9/10, worse with movement and b/l low back, aching, non-radiating, constant 4/10. Tried 37 yo muscle relaxer and heat w/o relief. Denies LE weakness, numbness, fecal/urinary incontinence and groin numbness.   3. Skin tags: L neck and R shoulder. Painful. R shoulder skin tag enlarging.   4. Moles: back. Painful. Not enlarging. No bleeding.   Review of Systems As per HPI     Objective:   Physical Exam BP 114/70  Ht 5\' 6"  (1.676 m)  Wt 174 lb (78.926 kg)  BMI 28.1 kg/m2  LMP 05/28/2012  Breastfeeding? No General appearance: alert, cooperative and no distress Skin: Skin tag, L neck small, larger R shoulder. 3 moles back. < head of penil, regular borders, slight color variation, light and brown.  Back Exam: Inspection: noormal  Motion:full, may with rotation and bending to the R SLR seated:         -                 SLR lying:- XSLR seated:         -               XSLR lying:- Seated HS Flexibility: full  Palpable tenderness: R supraspinatus area  FABER: negative  Sensory change: negative   Strength at foot Plantar-flexion: 5 / 5    Dorsi-flexion: 5 / 5    Eversion:  5/ 5   Inversion:  5/5  Leg strength Quad: 5 / 5   Hamstring:  5/ 5   Hip flexor:  5/ 5   Hip abductors: 5 / 5 Gait Walking: normal  Heels:  +  Toes: +       Tandem: +  PHQ-9: score 6. 0-5,6,8 and 9. 1-1,2,3 and 7. 2-4. Somewhat difficult to function.  GAD-7: score  17. 2-2,3,5,7. 3-1,4,6. Somewhat difficult to function.    Assessment and Plan:

## 2012-06-25 NOTE — Patient Instructions (Addendum)
Liandra,  Thank you for coming in today.  Please continue xanax once daily, goal is not needing it every day. If this is the case than we will back off to 15-20 pills per months.  Continue trazadone. Taper off celexa by taking one every other day for 4 days, then every 3rd day for for 6 days if you have not already stopped it.   For back pain: mobic once daily  Ice  Muscle relaxer-flexeril for a few weeks.  Continue to be active and stretch.  F/u in two weeks.  Dr. Armen Pickup

## 2012-07-10 ENCOUNTER — Ambulatory Visit: Payer: Medicaid Other | Admitting: Family Medicine

## 2012-08-09 ENCOUNTER — Encounter: Payer: Self-pay | Admitting: Family Medicine

## 2012-08-09 ENCOUNTER — Ambulatory Visit (INDEPENDENT_AMBULATORY_CARE_PROVIDER_SITE_OTHER): Payer: Medicaid Other | Admitting: Family Medicine

## 2012-08-09 VITALS — BP 101/65 | HR 91 | Temp 98.3°F | Ht 66.0 in | Wt 169.0 lb

## 2012-08-09 DIAGNOSIS — L909 Atrophic disorder of skin, unspecified: Secondary | ICD-10-CM

## 2012-08-09 DIAGNOSIS — F411 Generalized anxiety disorder: Secondary | ICD-10-CM

## 2012-08-09 DIAGNOSIS — L918 Other hypertrophic disorders of the skin: Secondary | ICD-10-CM

## 2012-08-09 MED ORDER — PAROXETINE HCL 10 MG PO TABS: 20.0000 mg | ORAL_TABLET | ORAL | Status: DC

## 2012-08-09 NOTE — Patient Instructions (Addendum)
[name],  Thank you for coming in today. Removed skin tags today.  For your anxiety, start paxil 10 mg daily for one week, then increase to 20 mg daily. F/u with me in 3 weeks.   Dr. Armen Pickup

## 2012-08-09 NOTE — Progress Notes (Signed)
Subjective:     Patient ID: [name], female   DOB: 1975/06/23, 37 y.o.   MRN: [id number]  HPI 36 yo F presents for f/u for the following:  1. Irritated skin tags R shoulder and L lateral neck. Returned today for removal.  2. Anxiety: Declined. Taking more xanax. Anxious at work and home. Feels like tongue gets large and has sensation of choking. She also feels palpitations. Recent stressors include her aunt having a stroke and working two jobs.   Review of Systems As per HPI     Objective:   Physical Exam BP 101/65  Pulse 91  Temp(Src) 98.3 F (36.8 C) (Oral)  Ht 5\' 6"  (1.676 m)  Wt 169 lb (76.658 kg)  BMI 27.29 kg/m2 General appearance: alert, cooperative and no distress Skin: Skin tag, L neck small, larger R shoulder.     Assessment and Plan:     A: skin tags. Irritated.   P: Skin tags are snipped off using Betadine for cleansing and sterile iris scissors. 2 cc of 2% lidocaine with epi was used at R shoulder skin tag. These pathognomonic lesions are not sent for pathology.

## 2012-08-09 NOTE — Assessment & Plan Note (Signed)
A:  Declined. Increased anxiety.  P: start Paxil 10 mg daily, increase to 20 mg after one week. F/u in 2-3 weeks.

## 2012-08-09 NOTE — Assessment & Plan Note (Signed)
Removed today. Tolerated well.

## 2012-08-29 ENCOUNTER — Ambulatory Visit: Payer: Medicaid Other | Admitting: Family Medicine

## 2012-10-07 ENCOUNTER — Other Ambulatory Visit: Payer: Self-pay | Admitting: *Deleted

## 2012-10-07 DIAGNOSIS — F411 Generalized anxiety disorder: Secondary | ICD-10-CM

## 2012-10-08 MED ORDER — PAROXETINE HCL 10 MG PO TABS: 20.0000 mg | ORAL_TABLET | ORAL | Status: DC

## 2012-11-06 ENCOUNTER — Other Ambulatory Visit: Payer: Medicaid Other

## 2012-11-06 DIAGNOSIS — Z206 Contact with and (suspected) exposure to human immunodeficiency virus [HIV]: Secondary | ICD-10-CM

## 2012-11-06 NOTE — Progress Notes (Signed)
Pt came for blood test HIV due to close friend, who also lived with her, was diagnosed with HIV.  Pt is very anxious and requesting testing. Order okay per Dr. Domenica Reamer, MLS

## 2012-11-07 LAB — HIV ANTIBODY (ROUTINE TESTING W REFLEX): HIV: NONREACTIVE

## 2012-11-11 ENCOUNTER — Encounter: Payer: Self-pay | Admitting: *Deleted

## 2012-11-11 ENCOUNTER — Telehealth: Payer: Self-pay | Admitting: *Deleted

## 2012-11-11 NOTE — Telephone Encounter (Signed)
LMOVM for pt to return call .Fleeger, Jessica Dawn  

## 2012-11-11 NOTE — Telephone Encounter (Signed)
Message copied by Osborne Oman on Mon Nov 11, 2012  9:25 AM ------      Message from: Dessa Phi      Created: Mon Nov 11, 2012  8:23 AM       HIV negative.       Please call patient. ------

## 2012-11-11 NOTE — Telephone Encounter (Signed)
Patient called back. Patient informed of Negative HIV result.

## 2012-12-04 IMAGING — US US OB DETAIL+14 WK
2 series · 16 of 28 positions shown · non-contrast
Comparison: none

[Series 1: us ob detail +14 wk · 1 of 5 slices shown (1 of 2)]
[im 1/5]
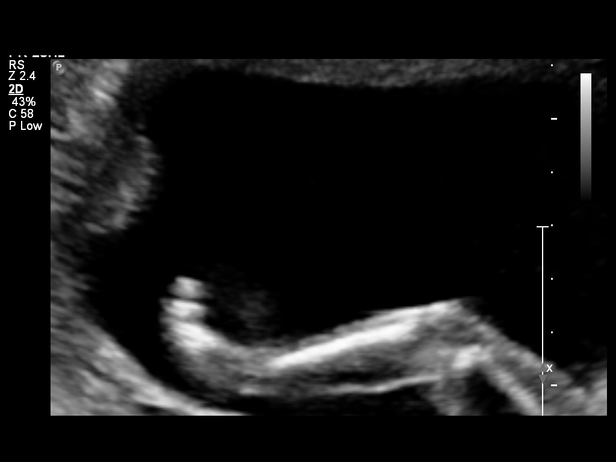

[Series 1: us ob detail +14 wk · 15 of 78 slices shown (2 of 2)]
[im 1/78]
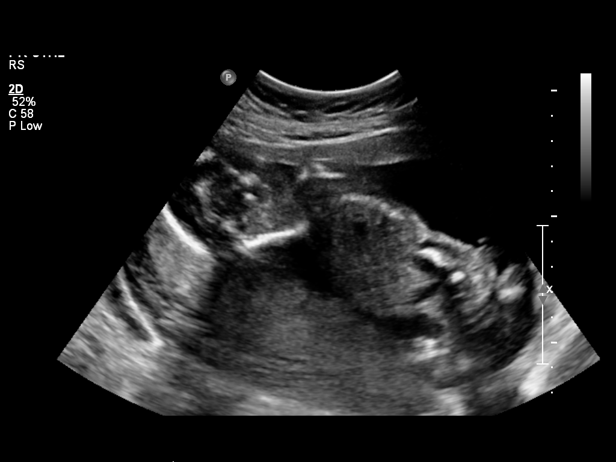
[im 7/78]
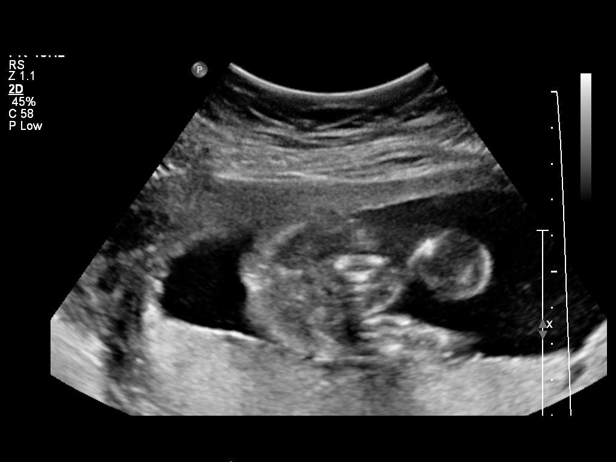
[im 13/78]
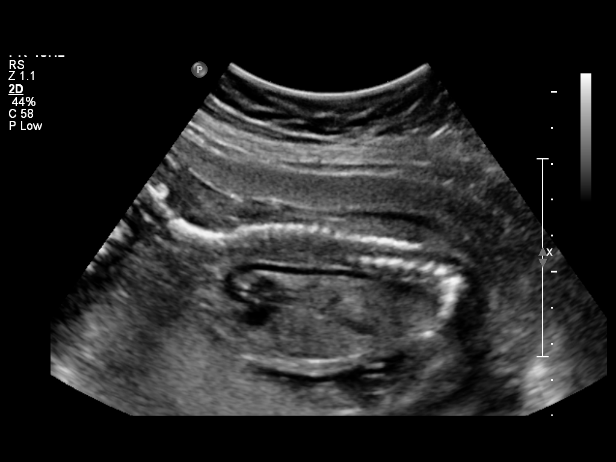
[im 16/78]
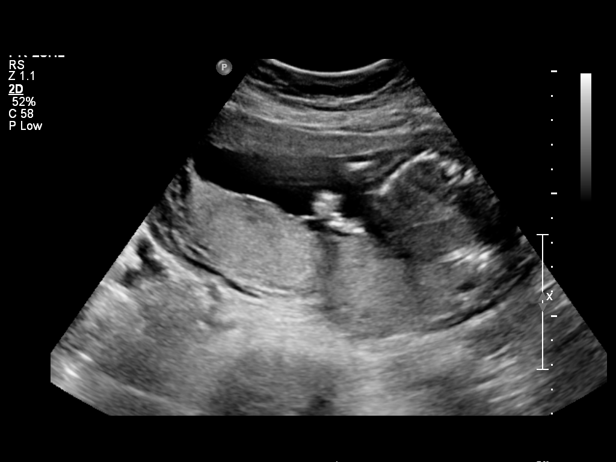
[im 22/78]
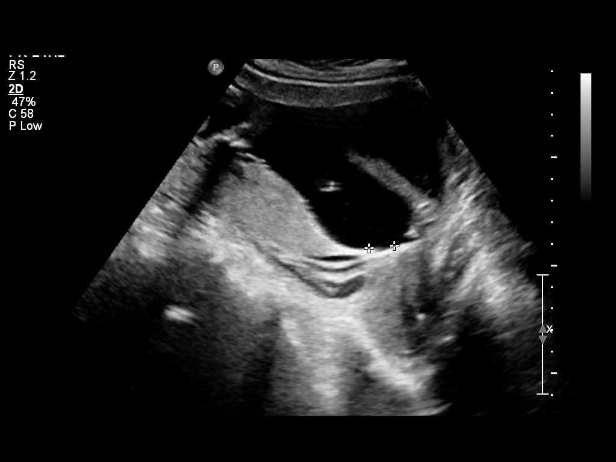
[im 28/78]
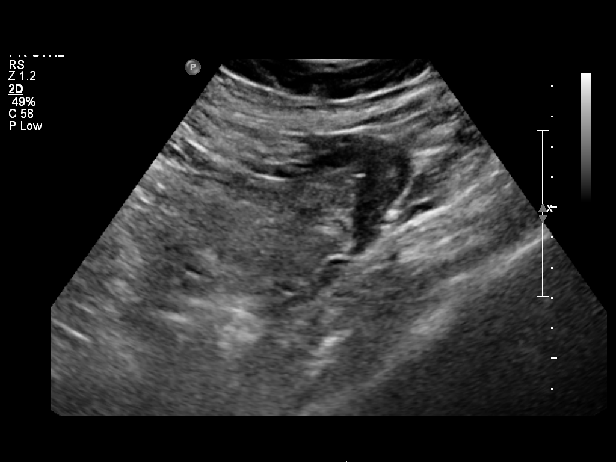
[im 34/78]
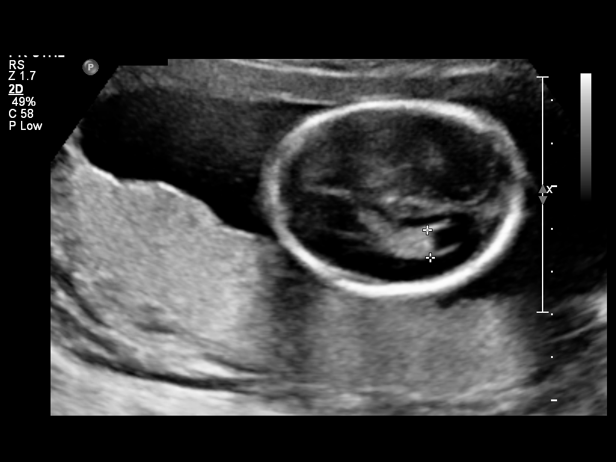
[im 37/78]
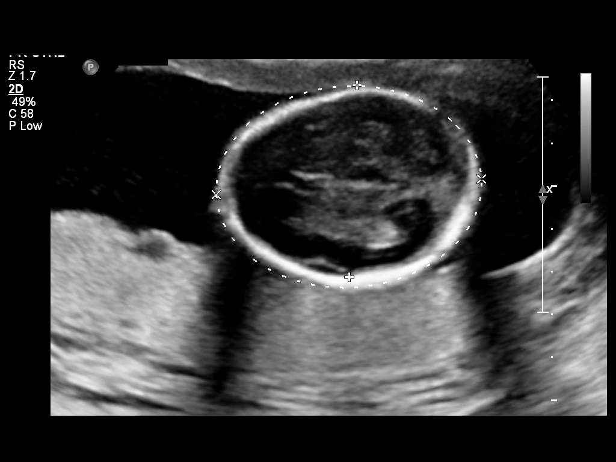
[im 44/78]
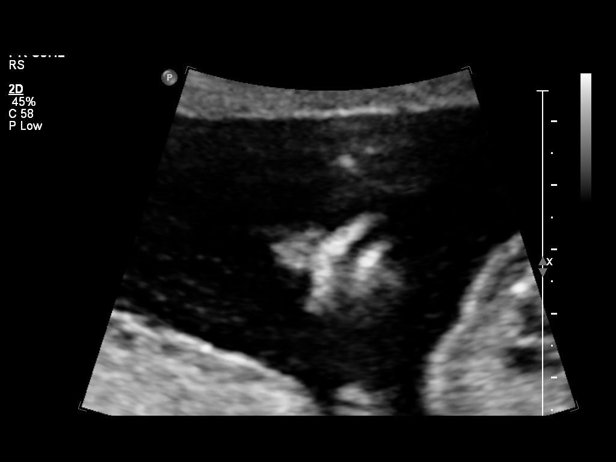
[im 50/78]
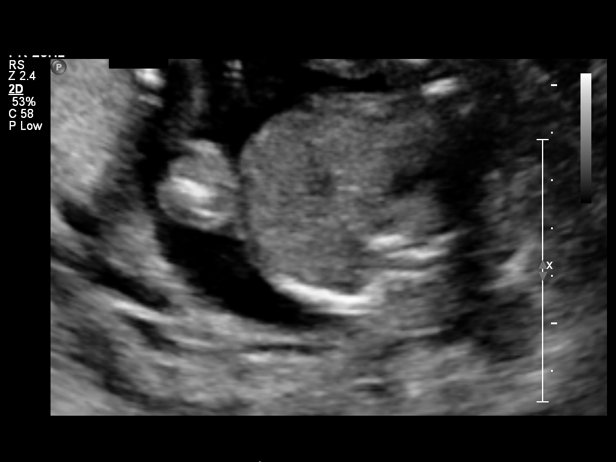
[im 56/78]
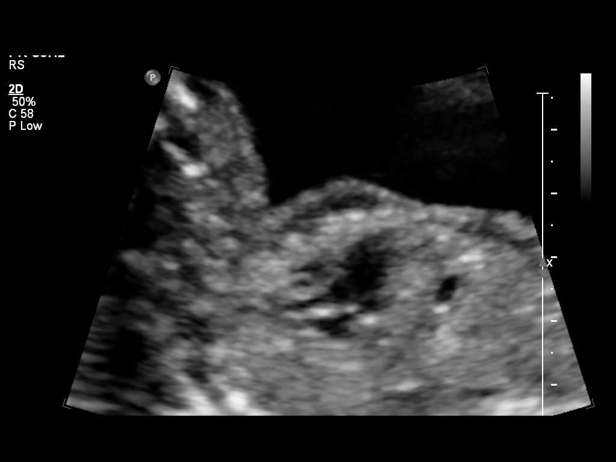
[im 59/78]
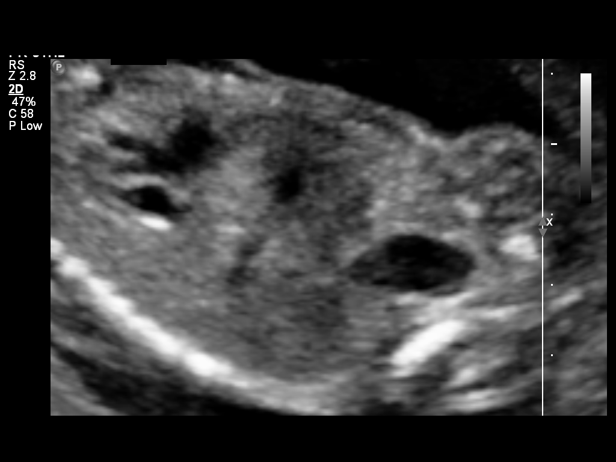
[im 65/78]
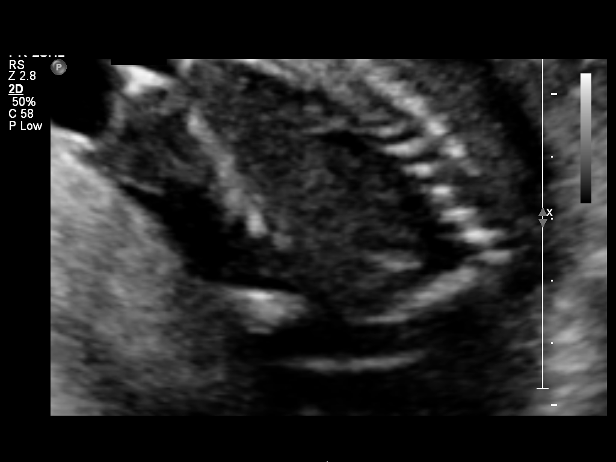
[im 71/78]
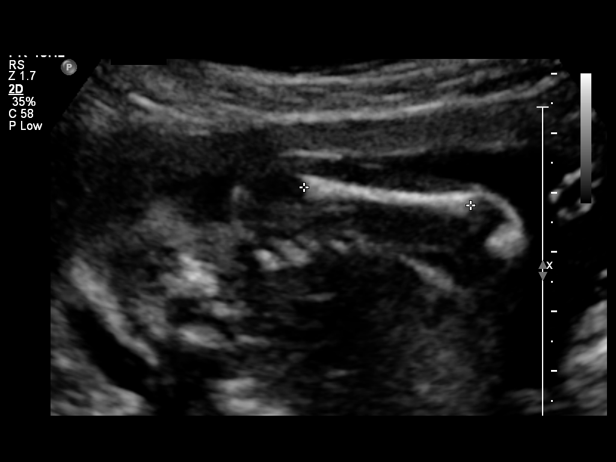
[im 78/78]
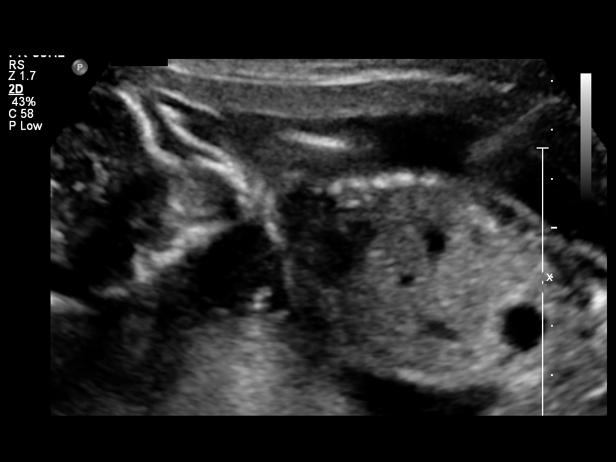

[16 of 28 positions shown; findings below may reference images not displayed]

OBSTETRICS REPORT
                      (Signed Final 10/09/2011 [DATE])

 Order#:         [id number]
Procedures

 US OB DETAIL + 14 WK                                  76811.0
Indications

 Detailed fetal anatomic survey
Fetal Evaluation

 Fetal Heart Rate:  150                         bpm
 Cardiac Activity:  Observed
 Presentation:      Breech
 Placenta:          Low-lying, 1.2cm from int
                    os
 P. Cord            Visualized
 Insertion:

 Amniotic Fluid
 AFI FV:      Subjectively within normal limits
                                             Larg Pckt:     4.5  cm
Biometry

 BPD:     44.1  mm    G. Age:   19w 2d                CI:        68.78   70 - 86
                                                      FL/HC:      17.1   16.1 -

 HC:     169.9  mm    G. Age:   19w 4d       58  %    HC/AC:      1.10   1.09 -

 AC:     153.9  mm    G. Age:   20w 4d       83  %    FL/BPD:
 FL:      29.1  mm    G. Age:   19w 0d       31  %    FL/AC:      18.9   20 - 24
 HUM:     28.1  mm    G. Age:   19w 0d       45  %
 CER:     18.9  mm    G. Age:   18w 3d       27  %
 NFT:      2.8  mm

 Est. FW:     313  gm    0 lb 11 oz      54  %
Gestational Age

 LMP:           19w 0d       Date:   05/29/11                 EDD:   03/04/12
 U/S Today:     19w 4d                                        EDD:   02/29/12
 Best:          19w 2d    Det. By:   U/S (09/06/11)           EDD:   03/02/12
Genetic Sonogram - Trisomy 21 Screening
 Age:                                             36          Risk=1:   303
 Echogenic bowel:                                 No          LR :
 Hypoplastic/absent Nasal bone:                   No
 Choroid plexus cysts:                            No
 Structural anomalies (inc. cardiac):             No          LR :
 Hypoplastic / absent midphalanx 5th Digit:       N/A
 Short femur:                                     No          LR :
 Wide space 4st-5nd toes:                         N/A
 Short humerus:                                   No          LR :
 2-vessel umbilical cord:                         No
 Pyelectasis:                                     No          LR :
 Echogenic cardiac foci:                          No          LR :
 Nuchal fold thickening >= 6 mm:                  No          LR :

 10 Of 12 Criteria Were Visualized and 0 Abnormal(s) Were Seen.
 Ultrasound Modified Risk for Fetal Down Syndrome = [DATE]
Anatomy

 Cranium:           Appears normal      Aortic Arch:       Appears normal
 Fetal Cavum:       Appears normal      Ductal Arch:       Not well
                                                           visualized
 Ventricles:        Appears normal      Diaphragm:         Appears normal
 Choroid Plexus:    Appears normal      Stomach:           Appears
                                                           normal, left
                                                           sided
 Cerebellum:        Appears normal      Abdomen:           Appears normal
 Posterior Fossa:   Appears normal      Abdominal Wall:    Appears nml
                                                           (cord insert,
                                                           abd wall)
 Nuchal Fold:       Appears normal      Cord Vessels:      Appears normal
                    (neck, nuchal                          (3 vessel cord)
                    fold)
 Face:              Appears normal      Kidneys:           Appear normal
                    (lips/profile/orbit
                    s)
 Heart:             Appears normal      Bladder:           Appears normal
                    (4 chamber &
                    axis)
 RVOT:              Appears normal      Spine:             Appears normal
 LVOT:              Appears normal      Limbs:             Four extremities
                                                           prev. seen

 Other:     Fetus appears to be a male. Nasal bone visualized.
            Heels visualized.
Targeted Anatomy

 Fetal Central Nervous System
 Lat. Ventricles:   6.5                 Cisterna Magna:
Cervix Uterus Adnexa

 Cervical Length:   4.62      cm

 Cervix:       Normal appearance by transabdominal scan.

 Left Ovary:   Within normal limits.
 Right Ovary:  Within normal limits.
 Adnexa:     No abnormality visualized.
Impression

 Single living IUP with assigned GA of 19w 2d. Appropriate
 fetal growth since earlier ultrasound.
 No fetal anatomic abnormality is idenfied. See modified US
 risk for Trisomy 21 above.
 Normal amniotic fluid volume and cervical length.

 questions or concerns.

## 2012-12-27 ENCOUNTER — Other Ambulatory Visit: Payer: Self-pay | Admitting: Family Medicine

## 2012-12-27 DIAGNOSIS — F411 Generalized anxiety disorder: Secondary | ICD-10-CM

## 2012-12-27 MED ORDER — ALPRAZOLAM 0.5 MG PO TABS: 0.5000 mg | ORAL_TABLET | Freq: Every day | ORAL | Status: DC | PRN

## 2012-12-27 NOTE — Telephone Encounter (Signed)
Refilled faxed xanax request.

## 2013-01-02 ENCOUNTER — Other Ambulatory Visit: Payer: Self-pay

## 2013-02-18 ENCOUNTER — Telehealth: Payer: Self-pay | Admitting: Family Medicine

## 2013-02-18 ENCOUNTER — Encounter: Payer: Self-pay | Admitting: Family Medicine

## 2013-02-18 ENCOUNTER — Ambulatory Visit (INDEPENDENT_AMBULATORY_CARE_PROVIDER_SITE_OTHER): Payer: Medicaid Other | Admitting: Family Medicine

## 2013-02-18 VITALS — BP 106/73 | HR 94 | Temp 98.4°F | Ht 66.0 in | Wt 169.0 lb

## 2013-02-18 DIAGNOSIS — N898 Other specified noninflammatory disorders of vagina: Secondary | ICD-10-CM

## 2013-02-18 DIAGNOSIS — F411 Generalized anxiety disorder: Secondary | ICD-10-CM

## 2013-02-18 LAB — POCT WET PREP (WET MOUNT): Clue Cells Wet Prep Whiff POC: NEGATIVE

## 2013-02-18 MED ORDER — METRONIDAZOLE 500 MG PO TABS: 2000.0000 mg | ORAL_TABLET | Freq: Once | ORAL | Status: DC

## 2013-02-18 MED ORDER — ALPRAZOLAM 0.5 MG PO TABS: 0.5000 mg | ORAL_TABLET | Freq: Every day | ORAL | Status: DC | PRN

## 2013-02-18 NOTE — Progress Notes (Signed)
   Subjective:    Patient ID: [name], female    DOB: [birth year], 37 y.o.   MRN: [id number]  HPI 37 yo F presents for f/u same day visit for the following:  1. Vaginal discharge: she denies odor and itching. Also denies pelvic and low back pain. She issexually active with one partner. Tubal ligation for contraception.   LMP: 01/27/13  Soc hx: former smoker.   Review of Systems As per HPI    Objective:   Physical Exam  Constitutional: She appears well-developed and well-nourished. No distress.  Abdominal: Soft. There is no tenderness.  Genitourinary: Uterus normal. Pelvic exam was performed with patient prone. There is no rash or lesion on the right labia. There is no rash or lesion on the left labia. Cervix exhibits discharge. Cervix exhibits no motion tenderness and no friability. Right adnexum displays no mass, no tenderness and no fullness. Left adnexum displays no mass, no tenderness and no fullness. There is tenderness around the vagina. Vaginal discharge found.  Homogenous white vaginal discharge    BP 106/73  Pulse 94  Temp(Src) 98.4 F (36.9 C) (Oral)  Ht 5\' 6"  (1.676 m)  Wt 169 lb (76.658 kg)  BMI 27.29 kg/m2  LMP 01/27/2013  Breastfeeding? No         Assessment & Plan:

## 2013-02-18 NOTE — Patient Instructions (Signed)
Alli,  Thank you for coming in today. The discharge looks like BV. I have sent in flagyl 2 grams (2000 mg) once with 3 refills. I will call with wet prep results.   I have refilled xanax.  Dr. Armen Pickup

## 2013-02-18 NOTE — Telephone Encounter (Signed)
Called patient. Negative wet prep. No need to take flagyl.

## 2013-02-18 NOTE — Assessment & Plan Note (Signed)
A: negative wet prep P: reassurance  Flagyl prn odor or increased discharge.

## 2013-05-21 ENCOUNTER — Telehealth: Payer: Self-pay | Admitting: Family Medicine

## 2013-05-21 NOTE — Telephone Encounter (Signed)
Pt stated that her menstrual cycle is 6-7 days late and she had a tubal one 01/2012.  Pt denies having any intermittent spotting, pelvic or abdominal pain.  Precepted with Dr. Adrian Blackwater, have patient wait about one month to see if cycle will start.  Inform pt that it could be stress or variation in her cycle now.  If she starts having severe abdominal or pelvic pain to call for office visit.  Pt stated understanding.  Derl Barrow, RN

## 2013-05-21 NOTE — Telephone Encounter (Signed)
Pt called and wanted to know if she should be worried about being pregnant. She had a tubal done with clamps on 02/27/12 and now she is 6 days late. jw

## 2013-12-29 ENCOUNTER — Encounter: Payer: Self-pay | Admitting: Family Medicine

## 2014-06-14 ENCOUNTER — Emergency Department (HOSPITAL_COMMUNITY): Payer: Managed Care, Other (non HMO)

## 2014-06-14 ENCOUNTER — Encounter (HOSPITAL_COMMUNITY): Payer: Self-pay | Admitting: Emergency Medicine

## 2014-06-14 ENCOUNTER — Emergency Department (HOSPITAL_COMMUNITY)
Admission: EM | Admit: 2014-06-14 | Discharge: 2014-06-14 | Disposition: A | Discharge status: Home / Self Care | Payer: Managed Care, Other (non HMO) | Attending: Emergency Medicine | Admitting: Emergency Medicine

## 2014-06-14 DIAGNOSIS — R1031 Right lower quadrant pain: Secondary | ICD-10-CM | POA: Insufficient documentation

## 2014-06-14 DIAGNOSIS — Z3202 Encounter for pregnancy test, result negative: Secondary | ICD-10-CM | POA: Insufficient documentation

## 2014-06-14 DIAGNOSIS — Z87891 Personal history of nicotine dependence: Secondary | ICD-10-CM | POA: Diagnosis not present

## 2014-06-14 DIAGNOSIS — F419 Anxiety disorder, unspecified: Secondary | ICD-10-CM | POA: Insufficient documentation

## 2014-06-14 DIAGNOSIS — Z8719 Personal history of other diseases of the digestive system: Secondary | ICD-10-CM | POA: Insufficient documentation

## 2014-06-14 DIAGNOSIS — Z87440 Personal history of urinary (tract) infections: Secondary | ICD-10-CM | POA: Insufficient documentation

## 2014-06-14 LAB — COMPREHENSIVE METABOLIC PANEL
ALT: 31 U/L (ref 0–35)
AST: 43 U/L — ABNORMAL HIGH (ref 0–37)
Albumin: 3.6 g/dL (ref 3.5–5.2)
Alkaline Phosphatase: 67 U/L (ref 39–117)
Anion gap: 9 (ref 5–15)
BUN: 10 mg/dL (ref 6–23)
CO2: 27 mmol/L (ref 19–32)
Calcium: 8.7 mg/dL (ref 8.4–10.5)
Chloride: 103 mmol/L (ref 96–112)
Creatinine, Ser: 0.78 mg/dL (ref 0.50–1.10)
GFR calc Af Amer: >90 mL/min (ref >90)
GFR calc non Af Amer: >90 mL/min (ref >90)
Glucose, Bld: 96 mg/dL (ref 70–99)
Potassium: 3.7 mmol/L (ref 3.5–5.1)
Sodium: 139 mmol/L (ref 135–145)
Total Bilirubin: 0.8 mg/dL (ref 0.3–1.2)
Total Protein: 7.2 g/dL (ref 6.0–8.3)

## 2014-06-14 LAB — URINALYSIS, ROUTINE W REFLEX MICROSCOPIC
Glucose, UA: NEGATIVE mg/dL
Hgb urine dipstick: NEGATIVE
Ketones, ur: NEGATIVE mg/dL
Leukocytes, UA: NEGATIVE
Nitrite: NEGATIVE
Protein, ur: NEGATIVE mg/dL
Specific Gravity, Urine: 1.036 — ABNORMAL HIGH (ref 1.005–1.030)
Urobilinogen, UA: 0.2 mg/dL (ref 0.0–1.0)
pH: 5 (ref 5.0–8.0)

## 2014-06-14 LAB — CBC WITH DIFFERENTIAL/PLATELET
Basophils Absolute: 0 10*3/uL (ref 0.0–0.1)
Basophils Relative: 1 % (ref 0–1)
Eosinophils Absolute: 0.1 10*3/uL (ref 0.0–0.7)
Eosinophils Relative: 2 % (ref 0–5)
HCT: 39.9 % (ref 36.0–46.0)
Hemoglobin: 13.2 g/dL (ref 12.0–15.0)
Lymphocytes Relative: 30 % (ref 12–46)
Lymphs Abs: 2 10*3/uL (ref 0.7–4.0)
MCH: 28.5 pg (ref 26.0–34.0)
MCHC: 33.1 g/dL (ref 30.0–36.0)
MCV: 86.2 fL (ref 78.0–100.0)
Monocytes Absolute: 0.5 10*3/uL (ref 0.1–1.0)
Monocytes Relative: 7 % (ref 3–12)
Neutro Abs: 4.1 10*3/uL (ref 1.7–7.7)
Neutrophils Relative %: 60 % (ref 43–77)
Platelets: 321 10*3/uL (ref 150–400)
RBC: 4.63 MIL/uL (ref 3.87–5.11)
RDW: 13.1 % (ref 11.5–15.5)
WBC: 6.7 10*3/uL (ref 4.0–10.5)

## 2014-06-14 LAB — POC URINE PREG, ED: Preg Test, Ur: NEGATIVE

## 2014-06-14 LAB — LIPASE, BLOOD: Lipase: 23 U/L (ref 11–59)

## 2014-06-14 MED ORDER — FENTANYL CITRATE (PF) 100 MCG/2ML IJ SOLN
50.0000 ug | Freq: Once | INTRAMUSCULAR | Status: AC
  Administered 2014-06-14: 50 ug via INTRAVENOUS
  Filled 2014-06-14: qty 2

## 2014-06-14 MED ORDER — IOHEXOL 300 MG/ML  SOLN
100.0000 mL | Freq: Once | INTRAMUSCULAR | Status: AC | PRN
  Administered 2014-06-14: 100 mL via INTRAVENOUS

## 2014-06-14 MED ORDER — IOHEXOL 300 MG/ML  SOLN
25.0000 mL | INTRAMUSCULAR | Status: AC
  Administered 2014-06-14: 25 mL via ORAL

## 2014-06-14 MED ORDER — ONDANSETRON HCL 4 MG/2ML IJ SOLN
4.0000 mg | Freq: Once | INTRAMUSCULAR | Status: AC
  Administered 2014-06-14: 4 mg via INTRAVENOUS
  Filled 2014-06-14: qty 2

## 2014-06-14 NOTE — ED Notes (Signed)
Patient transported to CT 

## 2014-06-14 NOTE — ED Provider Notes (Signed)
CSN: [id number]     Arrival date & time 06/14/14  1827 History   First MD Initiated Contact with Patient 06/14/14 1904     Chief Complaint  Patient presents with  . Abdominal Pain     (Consider location/radiation/quality/duration/timing/severity/associated sxs/prior Treatment) Patient is a 39 y.o. female presenting with abdominal pain. The history is provided by the patient. No language interpreter was used.  Abdominal Pain Associated symptoms: no dysuria, no vaginal bleeding and no vaginal discharge   Mr. Tolles is a 39 y.o female with a history of recurrent UTI's who presents for RLQ abdominal pain nausea, and diarrhea that began early this morning and has since worsened with nausea.  Her pain is 7/10 now.  Nothing makes it better or worse.  She ate chicken nuggets and french fries at noon and was able to keep it down.  No prior treatment. She denies any fever, chills, chest pain, shortness of breath, constipation, increased leg swelling or heavy lifting.  Past Medical History  Diagnosis Date  . UTI (lower urinary tract infection)     history of   . Anxiety   . Postpartum hemorrhage     with g2  . Back pain 06/25/2012  . Constipation 04/03/2012  . 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 No past surgeries    . Tubal ligation  02/27/2012    Procedure: POST PARTUM TUBAL LIGATION;  Surgeon: Woodroe Mode, MD;  Location: [facility];  Service: Gynecology;  Laterality: Bilateral;   Family History  Problem Relation Age of Onset  . Diabetes Mother   . Drug abuse Maternal Grandmother    History  Substance Use Topics  . Smoking status: Former Smoker    Quit date: 04/30/2006  . Smokeless tobacco: Never Used  . Alcohol Use: No   OB History    Gravida Para Term Preterm AB TAB SAB Ectopic Multiple Living   9 5 5  0 4 4    5      Review of Systems  Gastrointestinal: Positive for abdominal pain. Negative for abdominal distention.    Genitourinary: Negative for dysuria, frequency, vaginal bleeding, vaginal discharge and difficulty urinating.  All other systems reviewed and are negative.     Allergies  Review of patient's allergies indicates no known allergies.  Home Medications   Prior to Admission medications   Medication Sig Start Date End Date Taking? Authorizing Provider  ALPRAZolam Duanne Moron) 0.5 MG tablet Take 1 tablet (0.5 mg total) by mouth daily as needed for anxiety. 02/18/13   Josalyn Funches, MD  metroNIDAZOLE (FLAGYL) 500 MG tablet Take 4 tablets (2,000 mg total) by mouth once. 02/18/13   Josalyn Funches, MD   BP 96/74 mmHg  Pulse 84  Temp(Src) 98.1 F (36.7 C) (Oral)  Resp 18  Ht 5\' 6"  (1.676 m)  Wt 168 lb 6 oz (76.374 kg)  BMI 27.19 kg/m2  SpO2 100% Physical Exam  Constitutional: She is oriented to person, place, and time. She appears well-developed and well-nourished.  HENT:  Head: Normocephalic and atraumatic.  Eyes: Conjunctivae are normal.  Neck: Normal range of motion. Neck supple.  Cardiovascular: Normal rate, regular rhythm and normal heart sounds.   Pulmonary/Chest: Effort normal and breath sounds normal.  Abdominal: Soft. She exhibits no distension. There is tenderness in the right lower quadrant. There is no guarding and no CVA tenderness. No hernia.    Striae across lower abdomen.   Musculoskeletal: Normal range of motion.  Neurological: She is alert and oriented to person, place, and time.  Skin: Skin is warm and dry.  Nursing note and vitals reviewed.   ED Course  Procedures (including critical care time) Labs Review Labs Reviewed  URINALYSIS, ROUTINE W REFLEX MICROSCOPIC - Abnormal; Notable for the following:    Color, Urine AMBER (*)    APPearance CLOUDY (*)    Specific Gravity, Urine 1.036 (*)    Bilirubin Urine SMALL (*)    All other components within normal limits  COMPREHENSIVE METABOLIC PANEL - Abnormal; Notable for the following:    AST 43 (*)    All other  components within normal limits  CBC WITH DIFFERENTIAL/PLATELET  LIPASE, BLOOD  POC URINE PREG, ED    Imaging Review Ct Abdomen Pelvis W Contrast  06/14/2014   CLINICAL DATA:  Initial evaluation for acute abdominal pain.  EXAM: CT ABDOMEN AND PELVIS WITH CONTRAST  TECHNIQUE: Multidetector CT imaging of the abdomen and pelvis was performed using the standard protocol following bolus administration of intravenous contrast.  CONTRAST:  1 OMNIPAQUE IOHEXOL 300 MG/ML SOLN, 164mL OMNIPAQUE IOHEXOL 300 MG/ML SOLN  COMPARISON:  None available.  FINDINGS: Minimal subsegmental atelectasis seen dependently within the visualized lung bases. Visualized lungs are otherwise clear. No pleural or pericardial effusion.  Diffuse hypoattenuation of the liver is most consistent with steatosis. Liver is otherwise unremarkable. Gallbladder within normal limits. No biliary dilatation. Spleen, adrenal glands, and pancreas demonstrate a normal contrast enhanced appearance.  Kidneys are equal in size with symmetric enhancement. No nephrolithiasis, hydronephrosis, or focal enhancing renal mass.  Stomach unremarkable. Small bowel of normal caliber without evidence for acute inflammation or obstruction. Appendix well visualized in the right lower quadrant and is of normal caliber and appearance without associated inflammatory changes to suggest acute appendicitis. Colon is of normal caliber and appearance without acute inflammation or other abnormality.  The bladder within normal limits. Uterus and ovaries normal for patient age. Tubal ligation clips noted.  No free air or fluid. No pathologically enlarged intra-abdominal pelvic lymph nodes. Normal intravascular enhancement seen throughout the abdomen and pelvis.  No acute osseous abnormality. No worrisome lytic or blastic osseous lesion.  IMPRESSION: 1. No CT evidence for acute intra-abdominal or pelvic process identified. 2. Hepatic steatosis.   Electronically Signed   By: Jeannine Boga M.D.   On: 06/14/2014 22:05     EKG Interpretation None      MDM   Final diagnoses:  Right lower quadrant abdominal pain   Patient presents with RLQ abdominal pain with nausea and diarrhea since this morning.    21:19 Patient is in pain but does not want anything that will make her sleepy because her young daughter is in the room and she doesn't want to fall asleep. She is also driving home.  Her labs are unremarkable.  She is not pregnant and does not have a UTI. Her CT is negative for appendicitis or hydronephrosis. She is sitting with her daughter in the bed and on her phone smiling and and well appearing. I explained that she can take tylenol or motrin for pain and to return if she has worsening right sided abdominal pain with fever, nausea, and vomiting.  She agrees to follow up with her pcp.     Ottie Glazier, PA-C 06/14/14 6789  Dorie Rank, MD 06/17/14 [phone]

## 2014-06-14 NOTE — ED Notes (Signed)
C/o RLQ pain that radiates to R flank and nausea since 5:30 this morning.  Denies urinary complaints.  History of multiple kidney infections but states this pain seems different.  Reports constant dull pain with intermittent sharp pains.

## 2014-06-14 NOTE — Discharge Instructions (Signed)

## 2014-06-14 NOTE — ED Notes (Signed)
PA at bedside.

## 2014-07-29 ENCOUNTER — Ambulatory Visit (INDEPENDENT_AMBULATORY_CARE_PROVIDER_SITE_OTHER): Payer: Managed Care, Other (non HMO) | Admitting: Family Medicine

## 2014-07-29 VITALS — BP 122/53 | HR 94 | Temp 98.1°F | Wt 173.0 lb

## 2014-07-29 DIAGNOSIS — Z9851 Tubal ligation status: Secondary | ICD-10-CM | POA: Diagnosis not present

## 2014-07-29 DIAGNOSIS — N898 Other specified noninflammatory disorders of vagina: Secondary | ICD-10-CM | POA: Diagnosis not present

## 2014-07-29 DIAGNOSIS — R1031 Right lower quadrant pain: Secondary | ICD-10-CM | POA: Diagnosis not present

## 2014-07-29 DIAGNOSIS — F411 Generalized anxiety disorder: Secondary | ICD-10-CM | POA: Diagnosis not present

## 2014-07-29 DIAGNOSIS — L210 Seborrhea capitis: Secondary | ICD-10-CM

## 2014-07-29 DIAGNOSIS — L219 Seborrheic dermatitis, unspecified: Secondary | ICD-10-CM

## 2014-07-29 MED ORDER — ALPRAZOLAM 0.5 MG PO TABS: 0.5000 mg | ORAL_TABLET | Freq: Every day | ORAL | Status: DC | PRN

## 2014-07-29 MED ORDER — CITALOPRAM HYDROBROMIDE 10 MG PO TABS: 10.0000 mg | ORAL_TABLET | Freq: Every day | ORAL | Status: DC

## 2014-07-29 MED ORDER — KETOCONAZOLE 2 % EX SHAM: 1.0000 "application " | MEDICATED_SHAMPOO | CUTANEOUS | Status: DC

## 2014-07-29 MED ORDER — METRONIDAZOLE 500 MG PO TABS: 2000.0000 mg | ORAL_TABLET | Freq: Once | ORAL | Status: DC

## 2014-07-29 NOTE — Progress Notes (Signed)
   Subjective:    Patient ID: [name], female    DOB: Apr 11, 1975, 39 y.o.   MRN: [id number]  HPI  CC: wants clips taken out  # Abdominal pain:  tubal surgery in December 2013 by Dr. 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 ROS: no n/v/d, no constipation. No dysuria. +Vaginal discharge (reports chronic and no worse currently and had a PRN order from flagyl from last PCP)  # Mood:  Zoloft - likes better than paxil. Both gave her some day dreams but paxil was worse.   Day dreams - intrusive thoughts   Hasn't taken zoloft for 4-5 months  Xanax - takes about 1 per week, sometimes can go weeks without using. Last Rx fill was in 01/2013  # Dandruff  Tried: Head and shoulders and any kind of shampoo she could buy in the store  Not really itchy, but it is very bothersome for her due to appearances  Review of Systems   See HPI for ROS. All other systems reviewed and are negative.  Past medical history, surgical, family, and social history reviewed and updated in the EMR as appropriate. Objective:  BP 122/53 mmHg  Pulse 94  Temp(Src) 98.1 F (36.7 C) (Oral)  Wt 173 lb (78.472 kg)  LMP 06/30/2014 (Approximate) Vitals and nursing note reviewed  General: NAD CV: RRR, nl s1s2, no m/r/g Resp: CTAB, nl effort Abdomen: soft, tender RLQ without rebound or guarding, no masses appreciated. Bowel sounds are normal. Skin: dandruff present throughout scalp without alopecia, only mild scaling and erythema without any major lesions  Assessment & Plan:  See Problem List Documentation

## 2014-07-30 ENCOUNTER — Encounter: Payer: Self-pay | Admitting: Obstetrics & Gynecology

## 2014-07-30 DIAGNOSIS — L210 Seborrhea capitis: Secondary | ICD-10-CM | POA: Insufficient documentation

## 2014-07-30 DIAGNOSIS — R1031 Right lower quadrant pain: Secondary | ICD-10-CM | POA: Insufficient documentation

## 2014-07-30 NOTE — Assessment & Plan Note (Signed)
Worsened. Has been off zoloft for multiple months (reports 4-5 but actually might be longer as hasn't been seen since 01/2013). Also taking xanax about 1/week max, generally less. Reports "daydreams" with both paxil and zoloft (worse on paxil). Pt did not remember trying citalopram, sent in rx for this but on chart review later it does appear she was on this in 2014. Instructed pt to call office if daydreams become worse. Info given for counselor/therapist. F/u 6 weeks.

## 2014-07-30 NOTE — Assessment & Plan Note (Signed)
Rx ketoconazole shampoo twice weekly. Can increase frequency if not improving after a few weeks. F/u as needed.

## 2014-07-30 NOTE — Assessment & Plan Note (Signed)
Seen and evaluated in ED, had CT abd/pelv that was largely unremarkable. Pt describes issues with feelings of the surgical clips from her tubal. Pt requests referral as she wants to see if the clips can be removed. With negative imaging and lab workup in ED would favor this as etiology though other possibilities like IBS (though no report of diarrhea or constipation). Referral to ob/gyn placed (original surgery by Dr. Roselie Awkward).

## 2014-07-31 ENCOUNTER — Telehealth: Payer: Self-pay | Admitting: Family Medicine

## 2014-07-31 NOTE — Telephone Encounter (Signed)
Checking status of rx for xanax, says it was not at the pharmacy

## 2014-07-31 NOTE — Telephone Encounter (Signed)
Unable to reach patient.  No VM set up either.  This script was handed to her while she was in the clinic.  Please advise her to check papers she was given when she left.  Jazmin Hartsell,CMA

## 2014-08-26 ENCOUNTER — Encounter: Payer: Managed Care, Other (non HMO) | Admitting: Obstetrics & Gynecology

## 2014-09-16 ENCOUNTER — Encounter: Payer: Self-pay | Admitting: Family Medicine

## 2014-09-16 NOTE — Telephone Encounter (Signed)
Will forward to PCP for review. Zola Runion, CMA. 

## 2014-09-17 ENCOUNTER — Other Ambulatory Visit: Payer: Self-pay | Admitting: Family Medicine

## 2014-09-17 MED ORDER — PAROXETINE HCL 20 MG PO TABS: 20.0000 mg | ORAL_TABLET | Freq: Every day | ORAL | Status: DC

## 2014-09-19 ENCOUNTER — Other Ambulatory Visit: Payer: Self-pay | Admitting: Family Medicine

## 2014-09-23 ENCOUNTER — Encounter: Payer: Managed Care, Other (non HMO) | Admitting: Obstetrics & Gynecology

## 2014-09-26 ENCOUNTER — Encounter: Payer: Self-pay | Admitting: Family Medicine

## 2014-09-29 ENCOUNTER — Telehealth: Payer: Self-pay | Admitting: Family Medicine

## 2014-09-29 NOTE — Telephone Encounter (Signed)
Erroneous. Closing.

## 2014-10-23 ENCOUNTER — Encounter: Payer: Self-pay | Admitting: Obstetrics & Gynecology

## 2014-10-23 ENCOUNTER — Ambulatory Visit (INDEPENDENT_AMBULATORY_CARE_PROVIDER_SITE_OTHER): Payer: Managed Care, Other (non HMO) | Admitting: Obstetrics & Gynecology

## 2014-10-23 VITALS — BP 106/74 | HR 81 | Temp 98.5°F | Ht 66.0 in | Wt 173.2 lb

## 2014-10-23 DIAGNOSIS — Z118 Encounter for screening for other infectious and parasitic diseases: Secondary | ICD-10-CM | POA: Diagnosis not present

## 2014-10-23 DIAGNOSIS — R1031 Right lower quadrant pain: Secondary | ICD-10-CM

## 2014-10-23 DIAGNOSIS — F411 Generalized anxiety disorder: Secondary | ICD-10-CM | POA: Diagnosis not present

## 2014-10-23 DIAGNOSIS — Z124 Encounter for screening for malignant neoplasm of cervix: Secondary | ICD-10-CM

## 2014-10-23 DIAGNOSIS — Z1151 Encounter for screening for human papillomavirus (HPV): Secondary | ICD-10-CM

## 2014-10-23 DIAGNOSIS — Z113 Encounter for screening for infections with a predominantly sexual mode of transmission: Secondary | ICD-10-CM

## 2014-10-23 MED ORDER — AMITRIPTYLINE HCL 25 MG PO TABS: 25.0000 mg | ORAL_TABLET | Freq: Every day | ORAL | Status: DC

## 2014-10-23 NOTE — Progress Notes (Signed)
Patient ID: [name], female   DOB: [birth year], 39 y.o.   MRN: [id number]  Chief Complaint  Patient presents with  . Right lower quadrant pain    HPI [name] is a 39 y.o. female.  [id number] Patient's last menstrual period was 10/19/2014. S/p BTL pp 01/2012, has bilateral low abdominal pain almost daily, worse with exertion at her job. She wonders if BTL is related to her pain  HPI  Past Medical History  Diagnosis Date  . UTI (lower urinary tract infection)     history of   . Anxiety   . Postpartum hemorrhage     with g2  . Back pain 06/25/2012  . Constipation 04/03/2012  . 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 No past surgeries    . Tubal ligation  02/27/2012    Procedure: POST PARTUM TUBAL LIGATION;  Surgeon: Woodroe Mode, MD;  Location: [facility];  Service: Gynecology;  Laterality: Bilateral;    Family History  Problem Relation Age of Onset  . Drug abuse Maternal Grandmother   . Diabetes Father     Social History Social History  Substance Use Topics  . Smoking status: Former Smoker    Quit date: 04/30/2006  . Smokeless tobacco: Never Used  . Alcohol Use: No    No Known Allergies  Current Outpatient Prescriptions  Medication Sig Dispense Refill  . ALPRAZolam (XANAX) 0.5 MG tablet Take 1 tablet (0.5 mg total) by mouth daily as needed for anxiety. 30 tablet 0  . ketoconazole (NIZORAL) 2 % shampoo Apply 1 application topically 2 (two) times a week. 120 mL 0  . amitriptyline (ELAVIL) 25 MG tablet Take 1 tablet (25 mg total) by mouth at bedtime. 30 tablet 2   No current facility-administered medications for this visit.    Review of Systems Review of Systems  Constitutional: Negative.   Gastrointestinal: Positive for abdominal pain.  Genitourinary: Positive for pelvic pain. Negative for frequency, vaginal discharge and difficulty urinating.  Neurological:       Depression and does not sleep well   Psychiatric/Behavioral: Positive for sleep disturbance. The patient is nervous/anxious.     Blood pressure 106/74, pulse 81, temperature 98.5 F (36.9 C), height 5\' 6"  (1.676 m), weight 173 lb 3.2 oz (78.563 kg), last menstrual period 10/19/2014.  Physical Exam Physical Exam  Constitutional: She is oriented to person, place, and time. She appears well-developed. No distress.  Pulmonary/Chest: Effort normal. No respiratory distress.  Abdominal: Soft. She exhibits no mass. There is tenderness (very both sides). There is no guarding.  Genitourinary: Vagina normal and uterus normal. No vaginal discharge found.  Pap done no CMT mild s/p tenderness no mass  Neurological: She is alert and oriented to person, place, and time.  Skin: Skin is warm and dry.  Psychiatric: She has a normal mood and affect. Her behavior is normal.    Data Reviewed  CLINICAL DATA: Initial evaluation for acute abdominal pain.  EXAM: CT ABDOMEN AND PELVIS WITH CONTRAST  TECHNIQUE: Multidetector CT imaging of the abdomen and pelvis was performed using the standard protocol following bolus administration of intravenous contrast.  CONTRAST: 1 OMNIPAQUE IOHEXOL 300 MG/ML SOLN, 150mL OMNIPAQUE IOHEXOL 300 MG/ML SOLN  COMPARISON: None available.  FINDINGS: Minimal subsegmental atelectasis seen dependently within the visualized lung bases. Visualized lungs are otherwise clear. No pleural or pericardial effusion.  Diffuse hypoattenuation of the liver is most consistent with steatosis. Liver is otherwise  unremarkable. Gallbladder within normal limits. No biliary dilatation. Spleen, adrenal glands, and pancreas demonstrate a normal contrast enhanced appearance.  Kidneys are equal in size with symmetric enhancement. No nephrolithiasis, hydronephrosis, or focal enhancing renal mass.  Stomach unremarkable. Small bowel of normal caliber without evidence for acute inflammation or obstruction. Appendix  well visualized in the right lower quadrant and is of normal caliber and appearance without associated inflammatory changes to suggest acute appendicitis. Colon is of normal caliber and appearance without acute inflammation or other abnormality.  The bladder within normal limits. Uterus and ovaries normal for patient age. Tubal ligation clips noted.  No free air or fluid. No pathologically enlarged intra-abdominal pelvic lymph nodes. Normal intravascular enhancement seen throughout the abdomen and pelvis.  No acute osseous abnormality. No worrisome lytic or blastic osseous lesion.  IMPRESSION: 1. No CT evidence for acute intra-abdominal or pelvic process identified. 2. Hepatic steatosis.   Electronically Signed  By: Jeannine Boga M.D.  On: 06/14/2014 22:05     Assessment    Lower abd pain, not sure of origin Benign pelvic exam and nl CT scan Depression and anxiety, sleep d/o     Plan    Amitriptyline 25 mg po hs- pain and sleep RTC 6 weeks         Sahira Cataldi 10/23/2014, 8:49 AM

## 2014-10-26 LAB — GC/CHLAMYDIA PROBE AMP (~~LOC~~) NOT AT ARMC
Chlamydia: NEGATIVE
Neisseria Gonorrhea: NEGATIVE

## 2014-10-26 LAB — CYTOLOGY - PAP

## 2014-12-07 ENCOUNTER — Ambulatory Visit: Payer: Managed Care, Other (non HMO) | Admitting: Obstetrics & Gynecology

## 2014-12-23 ENCOUNTER — Ambulatory Visit (INDEPENDENT_AMBULATORY_CARE_PROVIDER_SITE_OTHER): Payer: Managed Care, Other (non HMO) | Admitting: Obstetrics & Gynecology

## 2014-12-23 ENCOUNTER — Encounter: Payer: Self-pay | Admitting: Obstetrics & Gynecology

## 2014-12-23 VITALS — BP 122/83 | HR 77 | Temp 98.5°F | Wt 173.4 lb

## 2014-12-23 DIAGNOSIS — R1031 Right lower quadrant pain: Secondary | ICD-10-CM

## 2014-12-23 NOTE — Patient Instructions (Signed)
Diagnostic Laparoscopy A diagnostic laparoscopy is a procedure to diagnose diseases in the abdomen. During the procedure, a thin, lighted, pencil-sized instrument called a laparoscope is inserted into the abdomen through an incision. The laparoscope allows your health care provider to look at the organs inside your body. LET [facility] CARE PROVIDER KNOW ABOUT:  Any allergies you have.  All medicines you are taking, including vitamins, herbs, eye drops, creams, and over-the-counter medicines.  Previous problems you or members of your family have had with the use of anesthetics.  Any blood disorders you have.  Previous surgeries you have had.  Medical conditions you have. RISKS AND COMPLICATIONS  Generally, this is a safe procedure. However, problems can occur, which may include:  Infection.  Bleeding.  Damage to other organs.  Allergic reaction to the anesthetics used during the procedure. BEFORE THE PROCEDURE  Do not eat or drink anything after midnight on the night before the procedure or as directed by your health care provider.  Ask your health care provider about:  Changing or stopping your regular medicines.  Taking medicines such as aspirin and ibuprofen. These medicines can thin your blood. Do not take these medicines before your procedure if your health care provider instructs you not to.  Plan to have someone take you home after the procedure. PROCEDURE  You may be given a medicine to help you relax (sedative).  You will be given a medicine to make you sleep (general anesthetic).  Your abdomen will be inflated with a gas. This will make your organs easier to see.  Small incisions will be made in your abdomen.  A laparoscope and other small instruments will be inserted into the abdomen through the incisions.  A tissue sample may be removed from an organ in the abdomen for examination.  The instruments will be removed from the abdomen.  The gas will be  released.  The incisions will be closed with stitches (sutures). AFTER THE PROCEDURE  Your blood pressure, heart rate, breathing rate, and blood oxygen level will be monitored often until the medicines you were given have worn off.   This information is not intended to replace advice given to you by your health care provider. Make sure you discuss any questions you have with your health care provider.   Document Released: 05/22/2000 Document Revised: 11/04/2014 Document Reviewed: 09/26/2013 Elsevier Interactive Patient Education [employer].

## 2014-12-23 NOTE — Progress Notes (Signed)
Subjective:     Patient ID: [name], female   DOB: [birth year], 39 y.o.   MRN: [id number]  HPI [id number] Patient's last menstrual period was 12/14/2014 (exact date). Still with daily pelvic pain which she feels originated with BTL with Filshie clips 01/2012.  Past Surgical History  Procedure Laterality Date  . No past surgeries    . Tubal ligation  02/27/2012    Procedure: POST PARTUM TUBAL LIGATION;  Surgeon: Woodroe Mode, MD;  Location: [facility];  Service: Gynecology;  Laterality: Bilateral;   Past Medical History  Diagnosis Date  . UTI (lower urinary tract infection)     history of   . Anxiety   . Postpartum hemorrhage     with g2  . Back pain 06/25/2012  . Constipation 04/03/2012  . 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 ALPRAZolam (XANAX) 0.5 MG tablet Take 1 tablet (0.5 mg total) by mouth daily as needed for anxiety. 30 tablet 0  . ketoconazole (NIZORAL) 2 % shampoo Apply 1 application topically 2 (two) times a week. 120 mL 0  . amitriptyline (ELAVIL) 25 MG tablet Take 1 tablet (25 mg total) by mouth at bedtime. (Patient not taking: Reported on 12/23/2014) 30 tablet 2   No current facility-administered medications on file prior to visit.    Review of Systems  Constitutional: Negative.   Genitourinary: Positive for menstrual problem (cramps) and pelvic pain. Negative for dysuria, urgency, vaginal bleeding and vaginal discharge.       Objective:   Physical Exam  Constitutional: She is oriented to person, place, and time. She appears well-developed. No distress.  Pulmonary/Chest: Effort normal. No respiratory distress.  Neurological: She is alert and oriented to person, place, and time.  Psychiatric:  Affect flat       Assessment:     Pelvic pain  Wants clips removed     Plan:     Dx laparoscopy and removal of Filshie clips as OP.  The procedure and the  risk of anesthesia, bleeding, infection, bowel and bladder injury  were discussed and her questions were answered. The procedure will be scheduled as an outpatient.   Woodroe Mode, MD 12/23/2014

## 2014-12-30 ENCOUNTER — Encounter (HOSPITAL_COMMUNITY): Payer: Self-pay | Admitting: *Deleted

## 2015-01-05 ENCOUNTER — Encounter: Payer: Self-pay | Admitting: General Practice

## 2015-01-08 ENCOUNTER — Other Ambulatory Visit: Payer: Self-pay | Admitting: Obstetrics & Gynecology

## 2015-01-14 NOTE — Anesthesia Preprocedure Evaluation (Addendum)
Anesthesia Evaluation  Patient identified by MRN, date of birth, ID band Patient awake    Reviewed: Allergy & Precautions, NPO status , Patient's Chart, lab work & pertinent test results  Airway Mallampati: II   Neck ROM: Full    Dental  (+) Dental Advisory Given, Teeth Intact   Pulmonary former smoker (quit 2008),    breath sounds clear to auscultation       Cardiovascular  Rhythm:Regular     Neuro/Psych Anxiety Depression    GI/Hepatic negative GI ROS, Neg liver ROS,   Endo/Other  negative endocrine ROS  Renal/GU negative Renal ROS     Musculoskeletal   Abdominal (+)  Abdomen: soft.    Peds  Hematology   Anesthesia Other Findings   Reproductive/Obstetrics                           Anesthesia Physical Anesthesia Plan  ASA: II  Anesthesia Plan: General   Post-op Pain Management:    Induction: Intravenous  Airway Management Planned: Oral ETT  Additional Equipment:   Intra-op Plan:   Post-operative Plan: Extubation in OR  Informed Consent: I have reviewed the patients History and Physical, chart, labs and discussed the procedure including the risks, benefits and alternatives for the proposed anesthesia with the patient or authorized representative who has indicated his/her understanding and acceptance.     Plan Discussed with:   Anesthesia Plan Comments: (Check am labs)       Anesthesia Quick Evaluation

## 2015-01-19 ENCOUNTER — Ambulatory Visit (HOSPITAL_COMMUNITY): Payer: Managed Care, Other (non HMO) | Admitting: Anesthesiology

## 2015-01-19 ENCOUNTER — Encounter (HOSPITAL_COMMUNITY)
Admission: RE | Disposition: A | Discharge status: Home / Self Care | Payer: Self-pay | Source: Ambulatory Visit | Attending: Obstetrics & Gynecology

## 2015-01-19 ENCOUNTER — Ambulatory Visit (HOSPITAL_COMMUNITY)
Admission: RE | Admit: 2015-01-19 | Discharge: 2015-01-19 | Disposition: A | Discharge status: Home / Self Care | Payer: Managed Care, Other (non HMO) | Source: Ambulatory Visit | Attending: Obstetrics & Gynecology | Admitting: Obstetrics & Gynecology

## 2015-01-19 ENCOUNTER — Encounter (HOSPITAL_COMMUNITY): Payer: Self-pay | Admitting: *Deleted

## 2015-01-19 DIAGNOSIS — Z87891 Personal history of nicotine dependence: Secondary | ICD-10-CM | POA: Insufficient documentation

## 2015-01-19 DIAGNOSIS — R102 Pelvic and perineal pain: Secondary | ICD-10-CM | POA: Insufficient documentation

## 2015-01-19 HISTORY — PX: LAPAROSCOPIC BILATERAL SALPINGECTOMY: SHX5889

## 2015-01-19 HISTORY — PX: LAPAROSCOPY: SHX197

## 2015-01-19 HISTORY — PX: LAPAROSCOPIC LYSIS OF ADHESIONS: SHX5905

## 2015-01-19 LAB — CBC
HCT: 43.4 % (ref 36.0–46.0)
Hemoglobin: 13.9 g/dL (ref 12.0–15.0)
MCH: 28.3 pg (ref 26.0–34.0)
MCHC: 32 g/dL (ref 30.0–36.0)
MCV: 88.4 fL (ref 78.0–100.0)
Platelets: 257 10*3/uL (ref 150–400)
RBC: 4.91 MIL/uL (ref 3.87–5.11)
RDW: 13.4 % (ref 11.5–15.5)
WBC: 7.9 10*3/uL (ref 4.0–10.5)

## 2015-01-19 LAB — PREGNANCY, URINE: Preg Test, Ur: NEGATIVE

## 2015-01-19 SURGERY — LAPAROSCOPY, DIAGNOSTIC
Anesthesia: General | Site: Abdomen | Laterality: Bilateral

## 2015-01-19 MED ORDER — GLYCOPYRROLATE 0.2 MG/ML IJ SOLN
INTRAMUSCULAR | Status: AC
  Filled 2015-01-19: qty 4

## 2015-01-19 MED ORDER — ROCURONIUM BROMIDE 100 MG/10ML IV SOLN
INTRAVENOUS | Status: AC
  Filled 2015-01-19: qty 1

## 2015-01-19 MED ORDER — MEPERIDINE HCL 25 MG/ML IJ SOLN: 6.2500 mg | INTRAMUSCULAR | Status: DC | PRN

## 2015-01-19 MED ORDER — NEOSTIGMINE METHYLSULFATE 10 MG/10ML IV SOLN
INTRAVENOUS | Status: AC
  Filled 2015-01-19: qty 1

## 2015-01-19 MED ORDER — LIDOCAINE HCL (CARDIAC) 20 MG/ML IV SOLN
INTRAVENOUS | Status: DC | PRN
  Administered 2015-01-19: 100 mg via INTRAVENOUS

## 2015-01-19 MED ORDER — LIDOCAINE HCL (CARDIAC) 20 MG/ML IV SOLN
INTRAVENOUS | Status: AC
  Filled 2015-01-19: qty 5

## 2015-01-19 MED ORDER — DEXAMETHASONE SODIUM PHOSPHATE 4 MG/ML IJ SOLN
INTRAMUSCULAR | Status: AC
  Filled 2015-01-19: qty 1

## 2015-01-19 MED ORDER — SCOPOLAMINE 1 MG/3DAYS TD PT72
1.0000 | MEDICATED_PATCH | Freq: Once | TRANSDERMAL | Status: DC
  Administered 2015-01-19: 1.5 mg via TRANSDERMAL

## 2015-01-19 MED ORDER — PROPOFOL 10 MG/ML IV BOLUS
INTRAVENOUS | Status: AC
  Filled 2015-01-19: qty 20

## 2015-01-19 MED ORDER — GLYCOPYRROLATE 0.2 MG/ML IJ SOLN
INTRAMUSCULAR | Status: AC
  Filled 2015-01-19: qty 3

## 2015-01-19 MED ORDER — ROCURONIUM BROMIDE 100 MG/10ML IV SOLN
INTRAVENOUS | Status: AC
Start: 2015-01-19 — End: 2015-01-19
  Filled 2015-01-19: qty 1

## 2015-01-19 MED ORDER — KETOROLAC TROMETHAMINE 30 MG/ML IJ SOLN
INTRAMUSCULAR | Status: AC
  Filled 2015-01-19: qty 1

## 2015-01-19 MED ORDER — NEOSTIGMINE METHYLSULFATE 10 MG/10ML IV SOLN
INTRAVENOUS | Status: DC | PRN
  Administered 2015-01-19: 4 mg via INTRAVENOUS

## 2015-01-19 MED ORDER — DEXAMETHASONE SODIUM PHOSPHATE 4 MG/ML IJ SOLN
INTRAMUSCULAR | Status: DC | PRN
  Administered 2015-01-19: 4 mg via INTRAVENOUS

## 2015-01-19 MED ORDER — FENTANYL CITRATE (PF) 100 MCG/2ML IJ SOLN
INTRAMUSCULAR | Status: AC
  Filled 2015-01-19: qty 2

## 2015-01-19 MED ORDER — DEXAMETHASONE SODIUM PHOSPHATE 10 MG/ML IJ SOLN
INTRAMUSCULAR | Status: AC
  Filled 2015-01-19: qty 1

## 2015-01-19 MED ORDER — ROCURONIUM BROMIDE 100 MG/10ML IV SOLN
INTRAVENOUS | Status: DC | PRN
  Administered 2015-01-19: 30 mg via INTRAVENOUS

## 2015-01-19 MED ORDER — GLYCOPYRROLATE 0.2 MG/ML IJ SOLN
INTRAMUSCULAR | Status: DC | PRN
  Administered 2015-01-19: .8 mg via INTRAVENOUS
  Administered 2015-01-19: 0.1 mg via INTRAVENOUS

## 2015-01-19 MED ORDER — KETOROLAC TROMETHAMINE 30 MG/ML IJ SOLN
INTRAMUSCULAR | Status: DC | PRN
  Administered 2015-01-19: 30 mg via INTRAVENOUS

## 2015-01-19 MED ORDER — ONDANSETRON HCL 4 MG/2ML IJ SOLN
INTRAMUSCULAR | Status: DC | PRN
  Administered 2015-01-19: 4 mg via INTRAVENOUS

## 2015-01-19 MED ORDER — MIDAZOLAM HCL 2 MG/2ML IJ SOLN
INTRAMUSCULAR | Status: DC | PRN
  Administered 2015-01-19: 2 mg via INTRAVENOUS

## 2015-01-19 MED ORDER — ONDANSETRON HCL 4 MG/2ML IJ SOLN
INTRAMUSCULAR | Status: AC
  Filled 2015-01-19: qty 2

## 2015-01-19 MED ORDER — HYDROMORPHONE HCL 1 MG/ML IJ SOLN
INTRAMUSCULAR | Status: AC
  Filled 2015-01-19: qty 1

## 2015-01-19 MED ORDER — MIDAZOLAM HCL 2 MG/2ML IJ SOLN
INTRAMUSCULAR | Status: AC
  Filled 2015-01-19: qty 2

## 2015-01-19 MED ORDER — PROMETHAZINE HCL 25 MG/ML IJ SOLN
INTRAMUSCULAR | Status: AC
  Filled 2015-01-19: qty 1

## 2015-01-19 MED ORDER — METOCLOPRAMIDE HCL 5 MG/ML IJ SOLN
INTRAMUSCULAR | Status: AC
  Administered 2015-01-19: 10 mg via INTRAVENOUS
  Filled 2015-01-19: qty 2

## 2015-01-19 MED ORDER — METOCLOPRAMIDE HCL 5 MG/ML IJ SOLN
10.0000 mg | Freq: Once | INTRAMUSCULAR | Status: AC | PRN
  Administered 2015-01-19: 10 mg via INTRAVENOUS

## 2015-01-19 MED ORDER — BUPIVACAINE HCL (PF) 0.25 % IJ SOLN
INTRAMUSCULAR | Status: AC
  Filled 2015-01-19: qty 30

## 2015-01-19 MED ORDER — PROMETHAZINE HCL 25 MG/ML IJ SOLN
6.2500 mg | INTRAMUSCULAR | Status: DC | PRN
  Administered 2015-01-19: 6.25 mg via INTRAVENOUS

## 2015-01-19 MED ORDER — LACTATED RINGERS IV SOLN: INTRAVENOUS | Status: DC

## 2015-01-19 MED ORDER — BUPIVACAINE HCL (PF) 0.25 % IJ SOLN
INTRAMUSCULAR | Status: DC | PRN
  Administered 2015-01-19: 11 mL

## 2015-01-19 MED ORDER — FENTANYL CITRATE (PF) 250 MCG/5ML IJ SOLN
INTRAMUSCULAR | Status: DC | PRN
Start: 2015-01-19 — End: 2015-01-19
  Administered 2015-01-19 (×4): 50 ug via INTRAVENOUS

## 2015-01-19 MED ORDER — OXYCODONE-ACETAMINOPHEN 5-325 MG PO TABS: 1.0000 | ORAL_TABLET | Freq: Four times a day (QID) | ORAL | Status: DC | PRN

## 2015-01-19 MED ORDER — IBUPROFEN 600 MG PO TABS: 600.0000 mg | ORAL_TABLET | Freq: Four times a day (QID) | ORAL | Status: DC | PRN

## 2015-01-19 MED ORDER — SCOPOLAMINE 1 MG/3DAYS TD PT72
MEDICATED_PATCH | TRANSDERMAL | Status: AC
  Administered 2015-01-19: 1.5 mg via TRANSDERMAL
  Filled 2015-01-19: qty 1

## 2015-01-19 MED ORDER — SODIUM CHLORIDE 0.9 % IJ SOLN
INTRAMUSCULAR | Status: AC
  Filled 2015-01-19: qty 10

## 2015-01-19 MED ORDER — LACTATED RINGERS IV SOLN
INTRAVENOUS | Status: DC
  Administered 2015-01-19 (×2): via INTRAVENOUS

## 2015-01-19 MED ORDER — PROPOFOL 10 MG/ML IV BOLUS
INTRAVENOUS | Status: DC | PRN
  Administered 2015-01-19: 160 mg via INTRAVENOUS

## 2015-01-19 MED ORDER — HYDROMORPHONE HCL 1 MG/ML IJ SOLN
INTRAMUSCULAR | Status: DC | PRN
Start: 2015-01-19 — End: 2015-01-19
  Administered 2015-01-19 (×2): 0.5 mg via INTRAVENOUS

## 2015-01-19 MED ORDER — FENTANYL CITRATE (PF) 250 MCG/5ML IJ SOLN
INTRAMUSCULAR | Status: AC
  Filled 2015-01-19: qty 5

## 2015-01-19 MED ORDER — FENTANYL CITRATE (PF) 100 MCG/2ML IJ SOLN
25.0000 ug | INTRAMUSCULAR | Status: DC | PRN
  Administered 2015-01-19: 50 ug via INTRAVENOUS

## 2015-01-19 SURGICAL SUPPLY — 26 items
CABLE HIGH FREQUENCY MONO STRZ (ELECTRODE) IMPLANT
CATH ROBINSON RED A/P 16FR (CATHETERS) IMPLANT
CHLORAPREP W/TINT 26ML (MISCELLANEOUS) ×3 IMPLANT
CLOTH BEACON ORANGE TIMEOUT ST (SAFETY) ×3 IMPLANT
DRSG COVADERM PLUS 2X2 (GAUZE/BANDAGES/DRESSINGS) IMPLANT
DRSG OPSITE POSTOP 3X4 (GAUZE/BANDAGES/DRESSINGS) ×3 IMPLANT
GLOVE BIO SURGEON STRL SZ 6.5 (GLOVE) ×3 IMPLANT
GLOVE BIOGEL PI IND STRL 7.0 (GLOVE) ×8 IMPLANT
GLOVE BIOGEL PI INDICATOR 7.0 (GLOVE) ×4
GOWN STRL REUS W/TWL LRG LVL3 (GOWN DISPOSABLE) ×9 IMPLANT
LIQUID BAND (GAUZE/BANDAGES/DRESSINGS) IMPLANT
NEEDLE INSUFFLATION 120MM (ENDOMECHANICALS) ×3 IMPLANT
NS IRRIG 1000ML POUR BTL (IV SOLUTION) ×3 IMPLANT
PACK LAPAROSCOPY BASIN (CUSTOM PROCEDURE TRAY) ×3 IMPLANT
PAD POSITIONING PINK XL (MISCELLANEOUS) ×3 IMPLANT
SET IRRIG TUBING LAPAROSCOPIC (IRRIGATION / IRRIGATOR) IMPLANT
SHEARS HARMONIC ACE PLUS 36CM (ENDOMECHANICALS) ×3 IMPLANT
STRIP CLOSURE SKIN 1/2X4 (GAUZE/BANDAGES/DRESSINGS) IMPLANT
SUT VICRYL 0 UR6 27IN ABS (SUTURE) ×3 IMPLANT
SUT VICRYL 4-0 PS2 18IN ABS (SUTURE) ×3 IMPLANT
TOWEL OR 17X24 6PK STRL BLUE (TOWEL DISPOSABLE) ×6 IMPLANT
TRAY FOLEY BAG SILVER LF 16FR (SET/KITS/TRAYS/PACK) ×3 IMPLANT
TROCAR XCEL DIL TIP R 11M (ENDOMECHANICALS) ×3 IMPLANT
TROCAR XCEL NON-BLD 5MMX100MML (ENDOMECHANICALS) ×6 IMPLANT
WARMER LAPAROSCOPE (MISCELLANEOUS) ×3 IMPLANT
WATER STERILE IRR 1000ML POUR (IV SOLUTION) IMPLANT

## 2015-01-19 NOTE — Discharge Instructions (Signed)
Salpingectomy, Care After Refer to this sheet in the next few weeks. These instructions provide you with information on caring for yourself after your procedure. Your health care provider may also give you more specific instructions. Your treatment has been planned according to current medical practices, but problems sometimes occur. Call your health care provider if you have any problems or questions after your procedure. WHAT TO EXPECT AFTER THE PROCEDURE After your procedure, it is typical to have the following:  Abdominal pain that can be controlled with pain medicine.  Vaginal spotting.  Tiredness. HOME CARE INSTRUCTIONS  Get plenty of rest and sleep.  Only take over-the-counter or prescription medicines as directed by your health care provider.  Keep incision areas clean and dry. Remove or change any bandages (dressings) only as directed by your health care provider.  You may resume your regular diet. Eat a well-balanced diet.  Drink enough fluids to keep your urine clear or pale yellow.  Limit exercise and activities as directed by your health care provider. Do not lift anything heavier than 5 lb (2.3 kg) until your health care provider approves.  Do not drive until your health care provider approves.  Do not have sexual intercourse until your health care provider says it is okay.  Take your temperature twice a day for the first week. Write those temperatures down.  Follow up with your health care provider as directed. SEEK MEDICAL CARE IF:  You have pain when you urinate.  You see pus coming out of the incision, or the incision is separating.  You have increasing abdominal pain.  You have swelling or redness in the incision area.  You develop a rash.  You feel lightheaded.  You have pain that is not controlled with medicine. SEEK IMMEDIATE MEDICAL CARE IF:  You develop a fever.  You have increasing abdominal pain.  You develop chest or leg pain.  You  develop shortness of breath.  You pass out.   This information is not intended to replace advice given to you by your health care provider. Make sure you discuss any questions you have with your health care provider.   Document Released: 05/20/2010 Document Revised: 03/06/2014 Document Reviewed: 08/07/2012 Elsevier Interactive Patient Education 2016 [place] INSTRUCTIONS: Laparoscopy  The following instructions have been prepared to help you care for yourself upon your return home today.  Wound care:  Do not get the incision wet for the first 24 hours. The incision should be kept clean and dry.  The Band-Aids or dressings may be removed the day after surgery.  Should the incision become sore, red, and swollen after the first week, check with your doctor.  Personal hygiene:  Shower the day after your procedure.  Activity and limitations:  Do NOT drive or operate any equipment today.  Do NOT lift anything more than 15 pounds for 2-3 weeks after surgery.  Do NOT rest in bed all day.  Walking is encouraged. Walk each day, starting slowly with 5-minute walks 3 or 4 times a day. Slowly increase the length of your walks.  Walk up and down stairs slowly.  Do NOT do strenuous activities, such as golfing, playing tennis, bowling, running, biking, weight lifting, gardening, mowing, or vacuuming for 2-4 weeks. Ask your doctor when it is okay to start.  Diet: Eat a light meal as desired this evening. You may resume your usual diet tomorrow.  Return to work: This is dependent on the type of work you do. For the  most part you can return to a desk job within a week of surgery. If you are more active at work, please discuss this with your doctor.  What to expect after your surgery: You may have a slight burning sensation when you urinate on the first day. You may have a very small amount of blood in the urine. Expect to have a small amount of vaginal discharge/light bleeding  for 1-2 weeks. It is not unusual to have abdominal soreness and bruising for up to 2 weeks. You may be tired and need more rest for about 1 week. You may experience shoulder pain for 24-72 hours. Lying flat in bed may relieve it.  Call your doctor for any of the following:  Develop a fever of 100.4 or greater  Inability to urinate 6 hours after discharge from hospital  Severe pain not relieved by pain medications  Persistent of heavy bleeding at incision site  Redness or swelling around incision site after a week  Increasing nausea or vomiting  Patient Signature________________________________________ Nurse Signature_________________________________________

## 2015-01-19 NOTE — Op Note (Signed)
[name] PROCEDURE DATE: 01/19/2015   PREOPERATIVE DIAGNOSIS:  Pelvic pain after sterilization  POSTOPERATIVE DIAGNOSIS:  Same, right lower quadrant adhesions  PROCEDURE:  Laparoscopic Bilateral Salpingectomy, lysis of adhesions   SURGEON: Woodroe Mode, MD   ASSISTANT: Darron Doom, MD  ANESTHESIA:  General endotracheal  COMPLICATIONS:  None immediate.  ESTIMATED BLOOD LOSS:  Less than 10 ml.  FLUIDS: 1000 ml LR.  URINE OUTPUT:  100 ml of clear urine.  INDICATIONS: 39 y.o. [id number] with pelvic pain after sterilization was done postpartum using Filshie clips in 2013 . She requests removal of the clips and accepts bilateral salpingectomy.  Risks of procedure discussed with patient including permanence of method, continued pain, bleeding, infection, injury to surrounding organs and need for additional procedures including laparotomy. Written informed consent was obtained.    FINDINGS:  Normal uterus, fallopian tubes with Filshie clips, and normal ovaries. Right lower quadrant adhesions with normal appendix  TECHNIQUE:  The patient was taken to the operating room where general anesthesia was obtained without difficulty.  She was then placed in the dorsal lithotomy position and prepared and draped in sterile fashion.  After an adequate timeout was performed, a bivalved speculum was then placed in the patient's vagina, and the anterior lip of cervix grasped with the single-tooth tenaculum.  The uterine manipulator was then advanced into the uterus.  The speculum was removed from the vagina.  Attention was then turned to the patient's abdomen where a 11-mm skin incision was made in the umbilical fold. Veress needle was placed and the abdomen was then insufflated with carbon dioxide gas. The 11-mm trocar and sleeve were then advanced without difficulty into the abdomen.  A survey of the patient's pelvis and abdomen revealed the findings above. Bilateral 5-mm lower quadrant ports were  then placed under direct visualization.  The fallopian tubes were transected from the uterine attachments and the underlying mesosalpinx with the Harmonic device allowing for bilateral salpingectomy. The Filshie clips wer adherent to the tubes. The fallopian tubes were then removed from the abdomen under direct visualization.  The operative site was surveyed, and it was found to be hemostatic.   No intraoperative injury to other surrounding organs was noted. Adhesions in the right lower quadrant near the appendix were lysed with the Harmonic scalpel.  The abdomen was desufflated and all instruments were then removed from the patient's abdomen. The fascial incision of the umbilicus was closed with a 0 Vicryl simple stitch. All skin incisions were closed with 4-0 Vicryl and Dermabond.  The uterine manipulator was removed from the cervix without complications. The patient tolerated the procedure well.  Sponge, lap, and needle counts were correct times two.  The patient was then taken to the recovery room awake, extubated and in stable condition.  The patient will be discharged to home as per PACU criteria.  Routine postoperative instructions given.  She was prescribed Percocet, and ibuprofen.  She will follow up in the clinic in 3 weeks for postoperative evaluation.  Woodroe Mode, MD, [place] Attending [place], [place]

## 2015-01-19 NOTE — Anesthesia Procedure Notes (Signed)
Procedure Name: Intubation Date/Time: 01/19/2015 12:43 PM Performed by: Brock Ra Pre-anesthesia Checklist: Suction available, Emergency Drugs available, Timeout performed, Patient identified and Patient being monitored Patient Re-evaluated:Patient Re-evaluated prior to inductionOxygen Delivery Method: Circle system utilized Preoxygenation: Pre-oxygenation with 100% oxygen Intubation Type: IV induction Ventilation: Mask ventilation without difficulty Laryngoscope Size: Mac and 3 Grade View: Grade II Tube size: 7.0 mm Number of attempts: 1 Airway Equipment and Method: Stylet Placement Confirmation: ETT inserted through vocal cords under direct vision,  breath sounds checked- equal and bilateral and positive ETCO2 Secured at: 21 cm Tube secured with: Tape Dental Injury: Teeth and Oropharynx as per pre-operative assessment

## 2015-01-19 NOTE — H&P (Signed)
   Patient ID: [name], female DOB: 1976-02-23, 39 y.o. MRN: [id number]  HPI [id number] Patient's last menstrual period was 12/14/2014 (exact date). Still with daily pelvic pain which she feels originated with BTL with Filshie clips 01/2012. Normal CT of abdomen and pelvis 05/2014.  Past Surgical History  Procedure Laterality Date  . No past surgeries    . Tubal ligation  02/27/2012    Procedure: POST PARTUM TUBAL LIGATION; Surgeon: Woodroe Mode, MD; Location: [facility]; Service: Gynecology; Laterality: Bilateral;   Past Medical History  Diagnosis Date  . UTI (lower urinary tract infection)     history of   . Anxiety   . Postpartum hemorrhage     with g2  . Back pain 06/25/2012  . Constipation 04/03/2012  . 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 ALPRAZolam (XANAX) 0.5 MG tablet Take 1 tablet (0.5 mg total) by mouth daily as needed for anxiety. 30 tablet 0  . ketoconazole (NIZORAL) 2 % shampoo Apply 1 application topically 2 (two) times a week. 120 mL 0  . amitriptyline (ELAVIL) 25 MG tablet Take 1 tablet (25 mg total) by mouth at bedtime. (Patient not taking: Reported on 12/23/2014) 30 tablet 2   No current facility-administered medications on file prior to visit.    Review of Systems  Constitutional: Negative.  Genitourinary: Positive for menstrual problem (cramps) and pelvic pain. Negative for dysuria, urgency, vaginal bleeding and vaginal discharge.       Objective:   Physical Exam  Filed Vitals:   01/19/15 1157 01/19/15 1158  BP:  121/82  Pulse: 98   Temp: 98.2 F (36.8 C)   Resp: 20     Constitutional: She is oriented to person, place, and time. She appears well-developed. No distress.  Pulmonary/Chest: Effort normal. No respiratory distress.  Neurological: She is alert and oriented  to person, place, and time.  Abdomen: ND, NT Psychiatric:  Normal affect      Assessment:     Pelvic pain since tubal ligation 3 years ago. Normal CT scan. Wants Filshie clips removed.     Plan:     Dx laparoscopy and removal of Filshie clips and bilateral salpingectomy as OP. The procedure and the risk of anesthesia, bleeding, infection, bowel and bladder injury were discussed and her questions were answered. The risk that she will still have pain was discussed and she voice understanding.  Woodroe Mode, MD 01/19/2015 12:05 PM

## 2015-01-19 NOTE — Anesthesia Postprocedure Evaluation (Signed)
Anesthesia Post Note  Patient: ALIESE MAYWEATHER  Procedure(s) Performed: Procedure(s) (LRB): DIAGNOSTIC LAPAROSCOPY (Bilateral) LAPAROSCOPIC BILATERAL SALPINGECTOMY with Filshe Clips LAPAROSCOPIC LYSIS OF ADHESIONS  Patient location during evaluation: PACU Anesthesia Type: General Level of consciousness: awake and alert Pain management: pain level controlled Vital Signs Assessment: post-procedure vital signs reviewed and stable Respiratory status: spontaneous breathing, nonlabored ventilation, respiratory function stable and patient connected to nasal cannula oxygen Cardiovascular status: blood pressure returned to baseline and stable Postop Assessment: No signs of nausea or vomiting Anesthetic complications: no    Last Vitals:  Filed Vitals:   01/19/15 1157 01/19/15 1158  BP:  121/82  Pulse: 98   Temp: 36.8 C   Resp: 20     Last Pain: There were no vitals filed for this visit.               Zygmunt Mcglinn

## 2015-01-19 NOTE — Transfer of Care (Signed)
Immediate Anesthesia Transfer of Care Note  Patient: IMANII ROOD  Procedure(s) Performed: Procedure(s): DIAGNOSTIC LAPAROSCOPY (Bilateral) LAPAROSCOPIC BILATERAL SALPINGECTOMY with Sharmon Leyden Clips LAPAROSCOPIC LYSIS OF ADHESIONS  Patient Location: PACU  Anesthesia Type:General  Level of Consciousness: awake, alert  and oriented  Airway & Oxygen Therapy: Patient Spontanous Breathing and Patient connected to nasal cannula oxygen  Post-op Assessment: Report given to RN and Post -op Vital signs reviewed and stable  Post vital signs: Reviewed and stable  Last Vitals:  Filed Vitals:   01/19/15 1158 01/19/15 1345  BP: 121/82   Pulse:  90  Temp:  36.5 C  Resp:  14    Complications: No apparent anesthesia complications

## 2015-01-20 ENCOUNTER — Encounter (HOSPITAL_COMMUNITY): Payer: Self-pay | Admitting: Obstetrics & Gynecology

## 2015-01-25 ENCOUNTER — Encounter: Payer: Self-pay | Admitting: General Practice

## 2015-02-04 ENCOUNTER — Ambulatory Visit (INDEPENDENT_AMBULATORY_CARE_PROVIDER_SITE_OTHER): Payer: Managed Care, Other (non HMO) | Admitting: Family Medicine

## 2015-02-04 ENCOUNTER — Encounter: Payer: Self-pay | Admitting: Family Medicine

## 2015-02-04 VITALS — BP 104/70 | HR 80 | Temp 98.1°F | Ht 66.0 in | Wt 176.0 lb

## 2015-02-04 DIAGNOSIS — F39 Unspecified mood [affective] disorder: Secondary | ICD-10-CM | POA: Insufficient documentation

## 2015-02-04 MED ORDER — ARIPIPRAZOLE 5 MG PO TABS: 5.0000 mg | ORAL_TABLET | Freq: Every day | ORAL | Status: DC

## 2015-02-04 MED ORDER — LORAZEPAM 0.5 MG PO TABS: 0.2500 mg | ORAL_TABLET | Freq: Two times a day (BID) | ORAL | Status: DC | PRN
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
## 2015-02-04 NOTE — Progress Notes (Signed)
Pre visit review using our clinic review tool, if applicable. No additional management support is needed unless otherwise documented below in the visit note. 

## 2015-02-04 NOTE — Patient Instructions (Addendum)
Stop amitriptyline.  Let's trial abilify 5mg  daily - sent to pharmacy. Try ativan 1/2 - 1 tablet as needed for anxiety. Pass by [facility] for referral to psychiatry. Check thyroid function today. questionairres today. Return in 4-6 wks for follow up.
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
## 2015-02-05 LAB — TSH: TSH: 1.39 u[IU]/mL (ref 0.35–4.50)

## 2015-02-07 ENCOUNTER — Other Ambulatory Visit: Payer: Self-pay | Admitting: Family Medicine

## 2015-03-10 ENCOUNTER — Ambulatory Visit (INDEPENDENT_AMBULATORY_CARE_PROVIDER_SITE_OTHER): Payer: Managed Care, Other (non HMO) | Admitting: Licensed Clinical Social Worker

## 2015-03-10 DIAGNOSIS — F39 Unspecified mood [affective] disorder: Secondary | ICD-10-CM | POA: Diagnosis not present
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
## 2015-04-22 ENCOUNTER — Ambulatory Visit: Payer: Self-pay | Admitting: Psychiatry

## 2015-05-18 ENCOUNTER — Ambulatory Visit (INDEPENDENT_AMBULATORY_CARE_PROVIDER_SITE_OTHER): Payer: Managed Care, Other (non HMO) | Admitting: Primary Care

## 2015-05-18 ENCOUNTER — Encounter: Payer: Self-pay | Admitting: Primary Care

## 2015-05-18 VITALS — BP 106/74 | HR 81 | Temp 97.8°F | Ht 66.0 in | Wt 173.1 lb

## 2015-05-18 DIAGNOSIS — L299 Pruritus, unspecified: Secondary | ICD-10-CM

## 2015-05-18 MED ORDER — PREDNISONE 20 MG PO TABS: ORAL_TABLET | ORAL | Status: DC

## 2015-05-18 NOTE — Patient Instructions (Signed)
Start Prednisone taper for itching. Take 3 tablets for 3 days, then 2 tablets for 3 days, then 1 tablet for 3 days.  Please call me if you experience no improvement in symptoms by Friday this week.  It was a pleasure meeting you!

## 2015-05-18 NOTE — Progress Notes (Signed)
Pre visit review using our clinic review tool, if applicable. No additional management support is needed unless otherwise documented below in the visit note. 

## 2015-05-18 NOTE — Progress Notes (Signed)
Subjective:    Patient ID: [name], female    DOB: 07-13-1975, 40 y.o.   MRN: [id number]  HPI  [name] is a 40 year old female with a history of dandruff who presents today with a chief complaint of pruritis. Her pruritis is present to her groin line, bilateral lower extremities, right upper extremity. She's been itching for 2 months. She had positive scabies in all 6 of her children, all have been treated, including herself, but symptoms of itching remain persist. She's washed everything in her household multiple times, changed detergents/soaps. She's also had treatment with permethrin cream 3 times to date with improvement in bite, but without improvement in itching. Denies fevers. She has no pets. 4 of her remaining children continue to itch.  Review of Systems  Constitutional: Negative for fever.  Respiratory: Negative for shortness of breath.   Cardiovascular: Negative for chest pain.  Skin: Positive for rash. Negative for wound.       Past Medical History  Diagnosis Date  . UTI (lower urinary tract infection)     history of   . Depression with anxiety   . Postpartum hemorrhage     2nd pregnancy  . Post partum depression 07/20/2011    Social History   Social History  . Marital Status: Single    Spouse Name: N/A  . Number of Children: N/A  . Years of Education: N/A   Occupational History  . Not on file.   Social History Main Topics  . Smoking status: Former Smoker    Quit date: 04/30/2006  . Smokeless tobacco: Never Used  . Alcohol Use: No     Comment: h/o alcohol abuse  . Drug Use: No     Comment: h/o MJ use  . Sexual Activity: Yes    Birth Control/ Protection: Surgical     Comment: 2-3 weeks ago   Other Topics Concern  . Not on file   Social History Narrative   Caffeine: 3-4 cans soda   Lives with 4 children. 2 grown children.   Occupation: works at [employer]: No regular exercise    Past Surgical History    Procedure Laterality Date  . Tubal ligation  02/27/2012    Procedure: POST PARTUM TUBAL LIGATION;  Surgeon: Woodroe Mode, MD;  Location: [facility];  Service: Gynecology;  Laterality: Bilateral;  . Laparoscopy Bilateral 01/19/2015    Procedure: DIAGNOSTIC LAPAROSCOPY;  Surgeon: Woodroe Mode, MD;  Location: [facility];  Service: Gynecology;  Laterality: Bilateral;  . Laparoscopic bilateral salpingectomy  01/19/2015    Procedure: LAPAROSCOPIC BILATERAL SALPINGECTOMY with Sharmon Leyden Clips;  Surgeon: Woodroe Mode, MD;  Location: [facility];  Service: Gynecology;;  . Laparoscopic lysis of adhesions  01/19/2015    Procedure: LAPAROSCOPIC LYSIS OF ADHESIONS;  Surgeon: Woodroe Mode, MD;  Location: [facility];  Service: Gynecology;;    Family History  Problem Relation Age of Onset  . Drug abuse Maternal Grandmother   . Diabetes Father   . Cancer Father     prostate  . Cancer Maternal Grandmother     throat, brain  . Stroke Maternal Grandfather   . Stroke Maternal Aunt   . CAD Maternal Uncle   . Diabetes Maternal Aunt   . Cancer Other     stomach, pancreas (maternal)    No Known Allergies  Current Outpatient Prescriptions on File Prior to Visit  Medication Sig Dispense Refill  . ARIPiprazole (ABILIFY) 5 MG tablet  Take 1 tablet (5 mg total) by mouth daily. 30 tablet 3  . ketoconazole (NIZORAL) 2 % shampoo APPLY 1 APPLICATION TOPICALLY 2 (TWO) TIMES A WEEK. 120 mL 3  . LORazepam (ATIVAN) 0.5 MG tablet Take 0.5-1 tablets (0.25-0.5 mg total) by mouth 2 (two) times daily as needed for anxiety. 30 tablet 0   No current facility-administered medications on file prior to visit.    BP 106/74 mmHg  Pulse 81  Temp(Src) 97.8 F (36.6 C) (Oral)  Ht 5\' 6"  (1.676 m)  Wt 173 lb 1.9 oz (78.527 kg)  BMI 27.96 kg/m2  SpO2 98%  LMP 04/23/2015    Objective:   Physical Exam  Constitutional: She appears well-nourished.  Cardiovascular: Normal rate and regular rhythm.   Pulmonary/Chest: Effort normal and  breath sounds normal.  Skin: Skin is warm and dry.  1-2 very mild raised red bumps to right groin, 1 present to her arm. No bites or evidence of irritation to webbing of fingers, lower extremities, posterior chest wall.          Assessment & Plan:  Pruritis:  To most of her body since diagnosis of scabies several weeks ago. Most of the family continues to itch except for 2. Exam today mostly unremarkable. Several very mild, non infectious red raised bumps. Does not appear to be new bites or burrows. Will treat for symptomatic itching with prednisone taper as she's been treated with permethrin cream 3 times now and has cleaned her house numerous times. Instructions provided for follow up if no improvement. Discussed possible side effects of prednisone.

## 2015-06-03 ENCOUNTER — Ambulatory Visit (INDEPENDENT_AMBULATORY_CARE_PROVIDER_SITE_OTHER): Payer: Managed Care, Other (non HMO) | Admitting: Family Medicine

## 2015-06-03 ENCOUNTER — Encounter: Payer: Self-pay | Admitting: Family Medicine

## 2015-06-03 VITALS — BP 126/74 | HR 92 | Temp 98.0°F | Wt 172.8 lb

## 2015-06-03 DIAGNOSIS — F39 Unspecified mood [affective] disorder: Secondary | ICD-10-CM | POA: Diagnosis not present

## 2015-06-03 DIAGNOSIS — L299 Pruritus, unspecified: Secondary | ICD-10-CM | POA: Diagnosis not present

## 2015-06-03 DIAGNOSIS — R0789 Other chest pain: Secondary | ICD-10-CM

## 2015-06-03 MED ORDER — BUPROPION HCL 75 MG PO TABS: 75.0000 mg | ORAL_TABLET | Freq: Two times a day (BID) | ORAL | Status: DC

## 2015-06-03 MED ORDER — CLONAZEPAM 0.5 MG PO TABS: 0.5000 mg | ORAL_TABLET | Freq: Every day | ORAL | Status: DC

## 2015-06-03 MED ORDER — HYDROXYZINE HCL 25 MG PO TABS: 12.5000 mg | ORAL_TABLET | Freq: Two times a day (BID) | ORAL | Status: DC | PRN

## 2015-06-03 NOTE — Assessment & Plan Note (Signed)
Presumed after scabies last month, not improving. Anticipate residual itching no residual infestation (has had 3 courses of permethrin). Will treat with hydroxyzine PRN itch, discussed sedation precautions.
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
## 2015-06-03 NOTE — Patient Instructions (Addendum)
Try hydroxyzine for itching and anxiety. Start at 1/2 tablet first (may make you sleepy).  Stop abilify and ativan.  Start wellbutrin 75mg  twice daily and klonopin for mood. Take klonopin 1 tablet at bedtime.  Update me if no better or if not tolerating medicines. Keep appointment with psychiatrist. Follow up in next few months, schedule physical at your convenience.

## 2015-06-03 NOTE — Progress Notes (Signed)
Pre visit review using our clinic review tool, if applicable. No additional management support is needed unless otherwise documented below in the visit note. 

## 2015-06-03 NOTE — Assessment & Plan Note (Signed)
Anticipate MDD + GAD with psychotic features - pending psychiatric evaluation next month. Failed multiple meds most recently abilify and ativan. Will trial wellbutrin 75mg  bid and klonopin 0.5mg  daily at bedtime (works 3rd shift). Continue seeing counselor.  No true SI/HI. Aware to stop med immediately and update [place] if not tolerating. RTC 2 mo f/u visit. Pt agrees with plan.

## 2015-06-09 ENCOUNTER — Encounter: Payer: Self-pay | Admitting: Family Medicine

## 2015-06-10 ENCOUNTER — Encounter: Payer: Self-pay | Admitting: Family Medicine

## 2015-06-10 NOTE — Telephone Encounter (Signed)
Please see Mychart message.

## 2015-06-12 MED ORDER — BUPROPION HCL 75 MG PO TABS: 75.0000 mg | ORAL_TABLET | Freq: Three times a day (TID) | ORAL | Status: DC

## 2015-06-15 ENCOUNTER — Telehealth: Payer: Self-pay | Admitting: Family Medicine

## 2015-06-15 NOTE — Telephone Encounter (Signed)
Pt dropped of FMLA paperwork In Dr Darnell Level IN BOX For review and signature

## 2015-06-20 NOTE — Telephone Encounter (Signed)
Filled out intermittent FMLA from 4/6 through 5/16 which is psychiatry appointment. In Kim's box. As far as fitness for duty form - we did not discuss this - has she needed to miss time from work? I don't think this qualifies separate from her FMLA which I filled out.

## 2015-06-21 ENCOUNTER — Encounter: Payer: Self-pay | Admitting: Family Medicine

## 2015-06-21 NOTE — Telephone Encounter (Signed)
plz call to schedule OV this week.

## 2015-06-22 NOTE — Telephone Encounter (Signed)
Appt scheduled

## 2015-06-22 NOTE — Telephone Encounter (Signed)
Coming in 06/24/15 to discuss status and for follow up.

## 2015-06-24 ENCOUNTER — Ambulatory Visit (INDEPENDENT_AMBULATORY_CARE_PROVIDER_SITE_OTHER): Payer: Managed Care, Other (non HMO) | Admitting: Family Medicine

## 2015-06-24 ENCOUNTER — Encounter: Payer: Self-pay | Admitting: Family Medicine

## 2015-06-24 VITALS — BP 110/80 | HR 84 | Temp 98.3°F | Wt 174.2 lb

## 2015-06-24 DIAGNOSIS — F39 Unspecified mood [affective] disorder: Secondary | ICD-10-CM | POA: Diagnosis not present

## 2015-06-24 MED ORDER — QUETIAPINE FUMARATE 50 MG PO TABS: 50.0000 mg | ORAL_TABLET | Freq: Every day | ORAL | Status: DC

## 2015-06-24 MED ORDER — LITHIUM CARBONATE 300 MG PO TABS: 300.0000 mg | ORAL_TABLET | Freq: Two times a day (BID) | ORAL | Status: DC

## 2015-06-24 NOTE — Progress Notes (Signed)
BP 110/80 mmHg  Pulse 84  Temp(Src) 98.3 F (36.8 C) (Oral)  Wt 174 lb 4 oz (79.039 kg)  LMP 06/24/2015   CC: f/u visit  Subjective:    Patient ID: [name], female    DOB: Jul 19, 1975, 40 y.o.   MRN: [id number]  HPI: [name] is a 41 y.o. female presenting on 06/24/2015 for Follow-up   See prior note for details - briefly, established with me 01/2015.  Continues seeing counselor. Amitriptyline previously not effective - worsened intrusive thoughts. Has been on zoloft, celexa, paxil, trazodone - all worsen thoughts. Abilify and ativan intensified emotions. Notes worsening paranoia as well as worsening depression/anxiety. Persistent thoughts of doom and family getting in car accidents. No active SI/HI.   Anticipated MDD + GAD. Last visit 06/03/2015 we started wellbutrin 75mg  BID + klonopin 0.5mg  at bedtime. Worsening in interim. Klonopin and wellbutrin not helpful - worsening depression - trouble getting out of the bed in the morning. Laying in bed. More difficulty functioning at work. Getting written up for missing work. increasd paranoid thoughts at work. Stopped both klonopin and welbutrin 3 days ago.   She does endorse episodes as soon as 2-3 wks ago of spending all her money or having multiple sexual partners without thinking about consequences. Increased paranoia with worsening mood over last 6-8 months. Denies SI/HI or AH/VH.  Has not tried prozac, effexor, or cymbalta.   Psych appt was rescheduled from May 16th to end of May.   Relevant past medical, surgical, family and social history reviewed and updated as indicated. Interim medical history since our last visit reviewed. Allergies and medications reviewed and updated. Current Outpatient Prescriptions on File Prior to Visit  Medication Sig  . buPROPion (WELLBUTRIN) 75 MG tablet Take 1 tablet (75 mg total) by mouth 3 (three) times daily. (Patient not taking: Reported on 06/24/2015)  . clonazePAM (KLONOPIN) 0.5 MG  tablet Take 0.25 mg by mouth daily. Reported on 06/24/2015  . hydrOXYzine (ATARAX/VISTARIL) 25 MG tablet Take 0.5-1 tablets (12.5-25 mg total) by mouth 2 (two) times daily as needed for anxiety or itching (sedation precautions). (Patient not taking: Reported on 06/24/2015)  . ketoconazole (NIZORAL) 2 % shampoo APPLY 1 APPLICATION TOPICALLY 2 (TWO) TIMES A WEEK. (Patient not taking: Reported on 06/03/2015)   No current facility-administered medications on file prior to visit.    Review of Systems Per HPI unless specifically indicated in ROS section     Objective:    BP 110/80 mmHg  Pulse 84  Temp(Src) 98.3 F (36.8 C) (Oral)  Wt 174 lb 4 oz (79.039 kg)  LMP 06/24/2015  Wt Readings from Last 3 Encounters:  06/24/15 174 lb 4 oz (79.039 kg)  06/03/15 172 lb 12 oz (78.359 kg)  05/18/15 173 lb 1.9 oz (78.527 kg)    Physical Exam  Constitutional: She appears well-developed and well-nourished. No distress.  Psychiatric: Her speech is normal. Judgment normal. Her mood appears anxious. Her affect is not angry, not blunt, not labile and not inappropriate. She is agitated. She is not aggressive, not hyperactive, not slowed, not withdrawn and not combative. Thought content is paranoid. Thought content is not delusional. Cognition and memory are normal. She exhibits a depressed mood. She expresses no homicidal and no suicidal ideation. She expresses no suicidal plans and no homicidal plans.  Cooperative, conversant, good eye contact  Nursing note and vitals reviewed.  Results for orders placed or performed in visit on 02/04/15  TSH  Result Value Ref  Range   TSH 1.39 0.35 - 4.50 uIU/mL      Assessment & Plan:   Problem List Items Addressed This Visit    Mood disorder ([place]) - Primary    ?mood disorder with psychosis vs schizoaffective disorder vs bipolar 2.  Still awaiting psychiatric evaluation over last 4 months - has appt scheduled for end of May. Progressive depressed mood with some  psychotic symptoms (endorsed paranoia) affecting ability to function at work. increased stress at work contributing. Will keep out of work for 2 weeks while we start new medication. Has tried and failed several medications including zoloft, celexa, paxil, trazodone, abilify, ativan, xanax, amitriptyline, wellbutrin and latest klonopin.  Will start mood stabilizer given concern for bipolar 2, will start seroquel 50mg  nightly for sleep. Discussed depakote vs lithium as well as side effects and monitoring parameters. Pt decides to try lithium - start at 300mg  BID.  Will write out of work from today 06/24/2015 with return to work on 07/08/2015.  Return in 1 month for follow up. She states she will have no insurance while she is out of work so cannot come in for sooner follow up but agrees to stay closely in touch via [place]. PHQ9 = 19/27, very difficult to function GAD7 = 18/21 MDQ = positive.       Relevant Orders   Ambulatory referral to Psychiatry       Follow up plan: Return in about 4 weeks (around 07/22/2015), or if symptoms worsen or fail to improve, for follow up visit.  Ria Bush, MD

## 2015-06-24 NOTE — Progress Notes (Signed)
Pre visit review using our clinic review tool, if applicable. No additional management support is needed unless otherwise documented below in the visit note. 

## 2015-06-24 NOTE — Patient Instructions (Addendum)
Stop klonopin and wellbutrin.  Out of work for 2 weeks. Form provided today.  Start seroquel 50mg  nightly.  Start lithium 300mg  twice daily.  Keep me updated with effect of new medicines.  Return in 1 month for follow up.

## 2015-06-25 NOTE — Assessment & Plan Note (Addendum)
?  mood disorder with psychosis vs schizoaffective disorder vs bipolar 2.  Still awaiting psychiatric evaluation over last 4 months - has appt scheduled for end of May. Progressive depressed mood with some psychotic symptoms (endorsed paranoia) affecting ability to function at work. increased stress at work contributing. Will keep out of work for 2 weeks while we start new medication. Has tried and failed several medications including zoloft, celexa, paxil, trazodone, abilify, ativan, xanax, amitriptyline, wellbutrin and latest klonopin.  Will start mood stabilizer given concern for bipolar 2, will start seroquel 50mg  nightly for sleep. Discussed depakote vs lithium as well as side effects and monitoring parameters. Pt decides to try lithium - start at 300mg  BID.  Will write out of work from today 06/24/2015 with return to work on 07/08/2015.  Return in 1 month for follow up. She states she will have no insurance while she is out of work so cannot come in for sooner follow up but agrees to stay closely in touch via [place]. PHQ9 = 19/27, very difficult to function GAD7 = 18/21 MDQ = positive.

## 2015-07-01 NOTE — Telephone Encounter (Signed)
Pt came in with a letter she received from her employment  Needing additional information  [name] can you help with this.  I don't have a copy of FMLA paper Letter in rx  [place]ower up front

## 2015-07-01 NOTE — Telephone Encounter (Signed)
This wasn't in the box up front. Do you have any idea where it may be?

## 2015-07-02 NOTE — Telephone Encounter (Signed)
i laid it on your keyboard this morning sorry

## 2015-07-02 NOTE — Telephone Encounter (Signed)
Updated and in Kim's box.

## 2015-07-02 NOTE — Telephone Encounter (Signed)
Diagnosis written as mood disorder w/ acute decompensation and forms placed in Kim's box.

## 2015-07-02 NOTE — Telephone Encounter (Signed)
FMLA forms didn't include a diagnosis. Please update and return to me. Thanks! In your IN box.

## 2015-07-05 NOTE — Telephone Encounter (Signed)
Updated forms faxed to [facility] at [phone]

## 2015-07-06 ENCOUNTER — Telehealth: Payer: Self-pay | Admitting: Family Medicine

## 2015-07-06 NOTE — Telephone Encounter (Addendum)
Alicia Black HR asst with [employer] left v/m requesting cb about FMLA info. One set of paperwork requesting pt out of work 06/24/15 -07/08/15; then another request for intermittent FMLA for the next yr. Request clarification.

## 2015-07-06 NOTE — Telephone Encounter (Signed)
Spoke with Elmo Putt. She agreed that the Fit for Duty form was for an injured employee and shouldn't have been included in her FMLA paperwork. They are going to disregard it and apply the dates to her FMLA.

## 2015-07-06 NOTE — Telephone Encounter (Signed)
Alicia,[employer],called to speak to [employer] about [employer].

## 2015-07-06 NOTE — Telephone Encounter (Signed)
[name] can you help with this   i don't have copy of fmla paperwork

## 2015-07-06 NOTE — Telephone Encounter (Signed)
FMLA forms in chart. Message left advising [name] that FMLA forms were to cover patient for intermittent leave when she returned to work and the dates she had currently missed. The Fitness for Duty (which they required [place] to fill out) required specific dates which would be 06/24/15-07/08/15. However, this form is typically used for a physically injured patient that needs clearance to return to work. This patient wasn't injured, but they still required the form to be filled out, so Dr. Darnell Level had to put dates on there.

## 2015-07-07 ENCOUNTER — Encounter: Payer: Self-pay | Admitting: Family Medicine

## 2015-07-07 MED ORDER — BUPROPION HCL 75 MG PO TABS: ORAL_TABLET | ORAL | Status: DC

## 2015-07-08 ENCOUNTER — Encounter: Payer: Self-pay | Admitting: Family Medicine

## 2015-07-12 ENCOUNTER — Ambulatory Visit: Payer: Managed Care, Other (non HMO) | Admitting: Licensed Clinical Social Worker

## 2015-07-15 ENCOUNTER — Encounter: Payer: Self-pay | Admitting: Family Medicine

## 2015-07-21 ENCOUNTER — Ambulatory Visit: Payer: Managed Care, Other (non HMO) | Admitting: Licensed Clinical Social Worker

## 2015-07-23 ENCOUNTER — Ambulatory Visit (INDEPENDENT_AMBULATORY_CARE_PROVIDER_SITE_OTHER): Payer: Self-pay | Admitting: Family Medicine

## 2015-07-23 ENCOUNTER — Encounter: Payer: Self-pay | Admitting: Family Medicine

## 2015-07-23 VITALS — BP 110/82 | HR 88 | Temp 98.1°F | Wt 176.8 lb

## 2015-07-23 DIAGNOSIS — F39 Unspecified mood [affective] disorder: Secondary | ICD-10-CM

## 2015-07-23 DIAGNOSIS — Z113 Encounter for screening for infections with a predominantly sexual mode of transmission: Secondary | ICD-10-CM

## 2015-07-23 DIAGNOSIS — N898 Other specified noninflammatory disorders of vagina: Secondary | ICD-10-CM

## 2015-07-23 LAB — BASIC METABOLIC PANEL
BUN: 14 mg/dL (ref 7–25)
CO2: 26 mmol/L (ref 20–31)
Calcium: 8.9 mg/dL (ref 8.6–10.2)
Chloride: 103 mmol/L (ref 98–110)
Creat: 0.83 mg/dL (ref 0.50–1.10)
Glucose, Bld: 75 mg/dL (ref 65–99)
Potassium: 4.2 mmol/L (ref 3.5–5.3)
Sodium: 140 mmol/L (ref 135–146)

## 2015-07-23 LAB — TSH: TSH: 1.59 mIU/L

## 2015-07-23 MED ORDER — BUSPIRONE HCL 5 MG PO TABS: 5.0000 mg | ORAL_TABLET | Freq: Two times a day (BID) | ORAL | Status: DC

## 2015-07-23 NOTE — Patient Instructions (Addendum)
Continue lithium 300mg  twice daily. Start buspar 5mg  daily for 4 days then increase to twice daily for anxiety.  Blood work today. Wet prep sent off today as well as STD screen.

## 2015-07-23 NOTE — Progress Notes (Signed)
Pre visit review using our clinic review tool, if applicable. No additional management support is needed unless otherwise documented below in the visit note. 

## 2015-07-23 NOTE — Progress Notes (Signed)
BP 110/82 mmHg  Pulse 88  Temp(Src) 98.1 F (36.7 C) (Oral)  Wt 176 lb 12 oz (80.173 kg)  LMP 07/23/2015   CC: f/u mood disorder, vaginal concerns  Subjective:    Patient ID: [name], female    DOB: Mar 28, 1975, 40 y.o.   MRN: [id number]  HPI: [name] is a 40 y.o. female presenting on 07/23/2015 for Follow-up and Bacterial Vaginosis   See prior notes for details. Multiple tried and failed psychotropics. To establish with psychiatry (working on getting this referral since 01/2015). Last visit concern for bipolar disorder, we started seroquel 50mg  nightly + lithium 300mg  bid. Even 25mg  seroquel knocked her out, increased irritability. Not noticing much change in lithium. We advised she stop seroquel.   Lost insurance through job - only working part time, didn't have enough hours to work. Now has medicaid.   Planning on going to [place] on Tuesday to establish care.   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   No short term disability available through work.   3 sexual partners in the last month. S/p BTL. Denies vag discharge. H/o BV.  H/o chlamydia and gonorrhea, h/o trich.  No recent STD check.   LMP today.   Relevant past medical, surgical, family and social history reviewed and updated as indicated. Interim medical history since our last visit reviewed. Allergies and medications reviewed and updated. Current Outpatient Prescriptions on File Prior to Visit  Medication Sig  . lithium 300 MG tablet Take 1 tablet (300 mg total) by mouth 2 (two) times daily.  Marland Kitchen ketoconazole (NIZORAL) 2 % shampoo APPLY 1 APPLICATION TOPICALLY 2 (TWO) TIMES A WEEK. (Patient not taking: Reported on 06/03/2015)   No current facility-administered medications on file prior to visit.    Review of Systems Per HPI unless specifically indicated in ROS section     Objective:    BP 110/82 mmHg  Pulse 88  Temp(Src) 98.1  F (36.7 C) (Oral)  Wt 176 lb 12 oz (80.173 kg)  LMP 07/23/2015  Wt Readings from Last 3 Encounters:  07/23/15 176 lb 12 oz (80.173 kg)  06/24/15 174 lb 4 oz (79.039 kg)  06/03/15 172 lb 12 oz (78.359 kg)    Physical Exam  Constitutional: She appears well-developed and well-nourished. No distress.  Genitourinary: Pelvic exam was performed with patient supine. There is no rash, tenderness, lesion or injury on the right labia. There is no rash, tenderness, lesion or injury on the left labia. There is bleeding in the vagina.  specimen collected without speculum currently on period  Psychiatric: She has a normal mood and affect. Her speech is normal and behavior is normal.  Calm, good eye contact  Nursing note and vitals reviewed.  Results for orders placed or performed in visit on 07/23/15  GC/Chlamydia Probe Amp  Result Value Ref Range   CT Probe RNA     GC Probe RNA    WET PREP BY MOLECULAR PROBE  Result Value Ref Range   Candida species NEG Negative   Trichomonas vaginosis NEG Negative   Gardnerella vaginalis POS (A) Negative  HIV antibody  Result Value Ref Range   HIV 1&2 Ab, 4th Generation NONREACTIVE NONREACTIVE  RPR  Result Value Ref Range   RPR Ser Ql NON REAC NON REAC  TSH  Result Value Ref Range   TSH 1.59 mIU/L  Basic metabolic panel  Result Value Ref Range  Sodium 140 135 - 146 mmol/L   Potassium 4.2 3.5 - 5.3 mmol/L   Chloride 103 98 - 110 mmol/L   CO2 26 20 - 31 mmol/L   Glucose, Bld 75 65 - 99 mg/dL   BUN 14 7 - 25 mg/dL   Creat 0.83 0.50 - 1.10 mg/dL   Calcium 8.9 8.6 - 10.2 mg/dL  Lithium level  Result Value Ref Range   Lithium Lvl <0.10 (L) 0.80 - 1.40 mEq/L      Assessment & Plan:   Problem List Items Addressed This Visit    Vaginal discharge    Complaint is more of vaginal odor not discharge or discomfort. H/o recurrent BV. Check wet prep today as well as STD screen. Update with results. Currently on period, no discharge noted on exam.         Relevant Orders   WET PREP BY MOLECULAR PROBE (Completed)   Mood disorder ([place]) - Primary    Ongoing mood disorder failed several psychotropics.  Bipolar 2 vs schizoaffective disorder vs mood disorder with psychosis (some ongoing paranoia).  rec continue lithium 300mg  bid - check TSH, Cr, Li levels.. Stop seroquel. Start buspar 5mg  bid (pt is very sensitive to meds) for anxiety.  Financial struggles complicate picture - she is now working part time, not enough hours for insurance through work, now has [employer]. Considering looking for a new job.       Relevant Orders   TSH (Completed)   Basic metabolic panel (Completed)   Lithium level (Completed)    Other Visit Diagnoses    Screen for STD (sexually transmitted disease)        Relevant Orders    GC/Chlamydia Probe Amp (Completed)    HIV antibody (Completed)    RPR (Completed)        Follow up plan: Return if symptoms worsen or fail to improve.  Ria Bush, MD

## 2015-07-24 ENCOUNTER — Other Ambulatory Visit: Payer: Self-pay | Admitting: Family Medicine

## 2015-07-24 LAB — WET PREP BY MOLECULAR PROBE
Candida species: NEGATIVE
Gardnerella vaginalis: POSITIVE — AB
Trichomonas vaginosis: NEGATIVE

## 2015-07-24 LAB — RPR

## 2015-07-24 LAB — GC/CHLAMYDIA PROBE AMP
CT Probe RNA: NOT DETECTED
GC Probe RNA: NOT DETECTED

## 2015-07-24 LAB — HIV ANTIBODY (ROUTINE TESTING W REFLEX): HIV 1&2 Ab, 4th Generation: NONREACTIVE

## 2015-07-24 LAB — LITHIUM LEVEL: Lithium Lvl: <0.1 mEq/L — ABNORMAL LOW (ref 0.80–1.40)

## 2015-07-24 MED ORDER — METRONIDAZOLE 500 MG PO TABS: 500.0000 mg | ORAL_TABLET | Freq: Two times a day (BID) | ORAL | Status: DC

## 2015-07-24 NOTE — Assessment & Plan Note (Signed)
Ongoing mood disorder failed several psychotropics.  Bipolar 2 vs schizoaffective disorder vs mood disorder with psychosis (some ongoing paranoia).  rec continue lithium 300mg  bid - check TSH, Cr, Li levels.. Stop seroquel. Start buspar 5mg  bid (pt is very sensitive to meds) for anxiety.  Financial struggles complicate picture - she is now working part time, not enough hours for insurance through work, now has [employer]. Considering looking for a new job.

## 2015-07-24 NOTE — Assessment & Plan Note (Signed)
Complaint is more of vaginal odor not discharge or discomfort. H/o recurrent BV. Check wet prep today as well as STD screen. Update with results. Currently on period, no discharge noted on exam.

## 2015-09-16 ENCOUNTER — Encounter: Payer: Self-pay | Admitting: Family Medicine

## 2015-09-20 NOTE — Telephone Encounter (Signed)
plz request latest psych notes from [facility]

## 2015-09-20 NOTE — Telephone Encounter (Signed)
Records requested

## 2015-09-23 ENCOUNTER — Encounter: Payer: Self-pay | Admitting: Family Medicine

## 2015-09-23 ENCOUNTER — Ambulatory Visit (INDEPENDENT_AMBULATORY_CARE_PROVIDER_SITE_OTHER): Payer: Self-pay | Admitting: Family Medicine

## 2015-09-23 DIAGNOSIS — F39 Unspecified mood [affective] disorder: Secondary | ICD-10-CM

## 2015-09-23 MED ORDER — BUPROPION HCL ER (XL) 150 MG PO TB24: 150.0000 mg | ORAL_TABLET | Freq: Every day | ORAL | 3 refills | Status: DC

## 2015-09-23 NOTE — Progress Notes (Signed)
BP 102/76   Pulse 84   Temp 98.2 F (36.8 C) (Oral)   Wt 176 lb (79.8 kg)   SpO2 98%   BMI 28.41 kg/m    CC: discuss mental health Subjective:    Patient ID: [name], female    DOB: 1975-09-08, 40 y.o.   MRN: [id number]  HPI: [name] is a 40 y.o. female presenting on 09/23/2015 for General questions about health   See prior notes for details. Last seen 2 months ago. Referred to [facility] psychiatry for mood disorder, failed several psychotropics. I have no records of psych eval at this time.   Started on depakote, wellbutrin, latuda. Diagnosed with bipolar disorder with psychotic episodes, anxiety, depression, and harm OCD. Saw substance abuse counselor once. Very frustrated with psychiatric care which she feels has been somewhat fragmented - seeing multiple different providers and unable to f/u as necessary.   Has been told she needed to start depakote a few weeks ago - but this has not yet been prescribed. Advised to continue her wellbutrin - but she was actually not taking this previously. Asks I fill medications. She has been given samples of latuda. Pt states latuda makes her very drowsy and asks for letter stating she would be better able to manage her mental illness with a transition to first shift (currently works third shift).   Relevant past medical, surgical, family and social history reviewed and updated as indicated. Interim medical history since our last visit reviewed. Allergies and medications reviewed and updated. Current Outpatient Prescriptions on File Prior to Visit  Medication Sig  . ketoconazole (NIZORAL) 2 % shampoo APPLY 1 APPLICATION TOPICALLY 2 (TWO) TIMES A WEEK. (Patient not taking: Reported on 06/03/2015)   No current facility-administered medications on file prior to visit.     Review of Systems Per HPI unless specifically indicated in ROS section     Objective:    BP 102/76   Pulse 84   Temp 98.2 F (36.8 C) (Oral)   Wt 176 lb (79.8  kg)   SpO2 98%   BMI 28.41 kg/m   Wt Readings from Last 3 Encounters:  09/23/15 176 lb (79.8 kg)  07/23/15 176 lb 12 oz (80.2 kg)  06/24/15 174 lb 4 oz (79 kg)    Physical Exam  Constitutional: She appears well-developed and well-nourished. No distress.  Psychiatric: She has a normal mood and affect. Her behavior is normal. Thought content normal.  Nursing note and vitals reviewed.     Assessment & Plan:   Problem List Items Addressed This Visit    Mood disorder [facility])    Per patient has been diagnosed with bipolar, depression/anxiety, and harm OCD. No records yet available from psych.  Advised she needs to continue f/u with psychiatry. Advised continue latuda, and to contact [facility] for depakote prescription. Advised I will likely not be able to prescribe latuda (due to insurance coverage) and don't have samples available.  As she was advised to continue wellbutrin (although she was not currently on this when seen by psych), I will prescribe wellbutrin 150mg  XL daily for temporary course until she is able to return to psych to fully establish care with them.  I will write letter for her recommending first shift, but will ultimately be up to work to accommodate this. Pt agrees with plan.       Other Visit Diagnoses   None.      Follow up plan: Return as needed.  Ria Bush,  MD

## 2015-09-23 NOTE — Patient Instructions (Addendum)
I will fill wellbutrin while we wait on psychiatry prescriptions. 150mg  once daily dosing sent to pharmacy.  Continue Latuda samples and check with [facility] about depakote refill.  I do want you to continue follow up with [facility] psychiatrist. Keep me updated  Good to see you today, call us with questions.

## 2015-09-23 NOTE — Progress Notes (Signed)
Pre visit review using our clinic review tool, if applicable. No additional management support is needed unless otherwise documented below in the visit note. 

## 2015-09-26 ENCOUNTER — Telehealth: Payer: Self-pay | Admitting: Family Medicine

## 2015-09-26 ENCOUNTER — Encounter: Payer: Self-pay | Admitting: Family Medicine

## 2015-09-26 NOTE — Assessment & Plan Note (Signed)
Per patient has been diagnosed with bipolar, depression/anxiety, and harm OCD. No records yet available from psych.  Advised she needs to continue f/u with psychiatry. Advised continue latuda, and to contact [facility] for depakote prescription. Advised I will likely not be able to prescribe latuda (due to insurance coverage) and don't have samples available.  As she was advised to continue wellbutrin (although she was not currently on this when seen by psych), I will prescribe wellbutrin 150mg  XL daily for temporary course until she is able to return to psych to fully establish care with them.  I will write letter for her recommending first shift, but will ultimately be up to work to accommodate this. Pt agrees with plan.

## 2015-09-26 NOTE — Telephone Encounter (Signed)
Letter for work in chart. plz notify ready to pick up. Thanks.  I recommended 1st shift but will be up to work to decide if they will accommodate this request.

## 2015-09-27 ENCOUNTER — Encounter: Payer: Self-pay | Admitting: Family Medicine

## 2015-09-27 NOTE — Telephone Encounter (Signed)
Patient notified and letter placed up front for pick up. 

## 2015-09-29 ENCOUNTER — Encounter: Payer: Self-pay | Admitting: Family Medicine

## 2015-10-07 ENCOUNTER — Encounter: Payer: Self-pay | Admitting: Family Medicine

## 2015-10-07 NOTE — Telephone Encounter (Signed)
Maudie Mercury can you help with this fmla paperwork I don't have a copy of the last one   Pt came in today  Stating the fmla paperwork says  Pt can work 4 hours a day for 1 day a week. Also it ends in 11/2015  [employer] said they did it for a year need to clarify if it ends in 11/2015 or 05/2016  In kims in box  Best number to call pt [phone]  Pt cannot go back to work until paperwork is complete.  She has already work her 4 hours this week  Please give me a copy of this when complete

## 2015-10-07 NOTE — Telephone Encounter (Signed)
Copy of FMLA paperwork from April given to [place].

## 2015-10-07 NOTE — Telephone Encounter (Signed)
fmla paperwork in dr g IN BOX Part B question 6 needs clarification Last FMLA paperwork also in folder

## 2015-10-07 NOTE — Telephone Encounter (Signed)
Shirlean Mylar is taking care of this.

## 2015-10-11 NOTE — Telephone Encounter (Signed)
Filled and in Kim's box. 

## 2015-10-12 ENCOUNTER — Telehealth: Payer: Self-pay | Admitting: Family Medicine

## 2015-10-12 NOTE — Telephone Encounter (Signed)
[name] @ [name] called having ? About fmla paperwork  1)She needs to know how many hours per day/ per week can pt work  2) on page 3 question 6 you have 4-8 hours per wk to be out of work to  attend office visit  .  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  [name] doesn't see where there is a conflict with pt work schedule with dr appointment since she works 3rd shift unless you think she needs off??  Right now pt is working 1 day a week 4 hours day  Wednesday 12 midnight to 4 am. Based on previously fmla    In dr g in box for clarification

## 2015-10-12 NOTE — Telephone Encounter (Signed)
Filled and in my outbox

## 2015-10-12 NOTE — Telephone Encounter (Signed)
Paperwork faxed 10/12/15 Sent pt my chart message letting her know Tried call pt  No voice mail  Copy for pt Copy for scan Copy for file

## 2015-10-13 NOTE — Telephone Encounter (Signed)
Paperwork faxed °Copy for pt °Copy for file °Copy for scan °

## 2015-10-14 NOTE — Telephone Encounter (Signed)
Faxed paperwork back 8/16.  [name] @ [employer] called needing clarification on page 3 question 7   Per dr g Frequency is 1 x per 1-2 week up to 1-2 days Spoke to [facility]

## 2016-03-06 ENCOUNTER — Ambulatory Visit: Payer: Medicaid Other | Admitting: Family Medicine

## 2016-08-11 ENCOUNTER — Other Ambulatory Visit (HOSPITAL_BASED_OUTPATIENT_CLINIC_OR_DEPARTMENT_OTHER): Payer: Self-pay

## 2016-08-11 DIAGNOSIS — G4710 Hypersomnia, unspecified: Secondary | ICD-10-CM

## 2016-08-11 DIAGNOSIS — R454 Irritability and anger: Secondary | ICD-10-CM

## 2016-08-11 DIAGNOSIS — R0683 Snoring: Secondary | ICD-10-CM

## 2016-08-11 DIAGNOSIS — R5383 Other fatigue: Secondary | ICD-10-CM

## 2016-09-05 ENCOUNTER — Ambulatory Visit (HOSPITAL_BASED_OUTPATIENT_CLINIC_OR_DEPARTMENT_OTHER): Payer: Medicaid Other | Attending: Nurse Practitioner | Admitting: Internal Medicine

## 2016-09-05 DIAGNOSIS — G4710 Hypersomnia, unspecified: Secondary | ICD-10-CM | POA: Insufficient documentation

## 2016-09-05 DIAGNOSIS — R454 Irritability and anger: Secondary | ICD-10-CM | POA: Diagnosis not present

## 2016-09-05 DIAGNOSIS — G4733 Obstructive sleep apnea (adult) (pediatric): Secondary | ICD-10-CM | POA: Diagnosis not present

## 2016-09-05 DIAGNOSIS — G4730 Sleep apnea, unspecified: Secondary | ICD-10-CM | POA: Diagnosis present

## 2016-09-05 DIAGNOSIS — R5383 Other fatigue: Secondary | ICD-10-CM | POA: Insufficient documentation

## 2016-09-05 DIAGNOSIS — R0683 Snoring: Secondary | ICD-10-CM | POA: Insufficient documentation

## 2016-09-09 DIAGNOSIS — R0683 Snoring: Secondary | ICD-10-CM

## 2016-09-09 NOTE — Procedures (Signed)
  Patient Name: Stacy, Flowers Date: 09/05/2016 Gender: Female D.O.B: 08-Aug-1975 Age (years): 41 Referring Provider: Olena Mater NP Height (inches): 97 Interpreting Physician: Baird Lyons MD, ABSM Weight (lbs): 185 RPSGT: Laren Everts BMI: 30 MRN: [id number] Neck Size: 14.00 CLINICAL INFORMATION Sleep Study Type: NPSG  Indication for sleep study: Excessive Daytime Sleepiness, Fatigue, Morning Headaches, Obesity, Sleep walking/talking/parasomnias, Snoring  Epworth Sleepiness Score: 22  SLEEP STUDY TECHNIQUE As per the AASM Manual for the Scoring of Sleep and Associated Events v2.3 (April 2016) with a hypopnea requiring 4% desaturations.  The channels recorded and monitored were frontal, central and occipital EEG, electrooculogram (EOG), submentalis EMG (chin), nasal and oral airflow, thoracic and abdominal wall motion, anterior tibialis EMG, snore microphone, electrocardiogram, and pulse oximetry.  MEDICATIONS Medications self-administered by patient taken the night of the study : none reported  SLEEP ARCHITECTURE The study was initiated at 9:54:11 PM and ended at 4:15:49 AM.  Sleep onset time was 45.1 minutes and the sleep efficiency was 76.8%. The total sleep time was 293.0 minutes.  Stage REM latency was 160.0 minutes.  The patient spent 8.02% of the night in stage N1 sleep, 78.50% in stage N2 sleep, 1.71% in stage N3 and 11.77% in REM.  Alpha intrusion was absent.  Supine sleep was 46.76%.  RESPIRATORY PARAMETERS The overall apnea/hypopnea index (AHI) was 0.2 per hour. There were 1 total apneas, including 1 obstructive, 0 central and 0 mixed apneas. There were 0 hypopneas and 15 RERAs.  The AHI during Stage REM sleep was 0.0 per hour.  AHI while supine was 0.4 per hour.  The mean oxygen saturation was 96.11%. The minimum SpO2 during sleep was 94.00%.  Moderate snoring was noted during this study.  CARDIAC DATA The 2 lead EKG demonstrated sinus  rhythm. The mean heart rate was 76.96 beats per minute. Other EKG findings include: None.  LEG MOVEMENT DATA The total PLMS were 0 with a resulting PLMS index of 0.00. Associated arousal with leg movement index was 0.0 .  IMPRESSIONS - No significant obstructive sleep apnea occurred during this study (AHI = 0.2/h). - No significant central sleep apnea occurred during this study (CAI = 0.0/h). - The patient had minimal or no oxygen desaturation during the study (Min O2 = 94.00%) - The patient snored with Moderate snoring volume. - No cardiac abnormalities were noted during this study. - Clinically significant periodic limb movements did not occur during sleep. No significant associated arousals  DIAGNOSIS - Primary Snoring (786.09 [R06.83 ICD-10])  RECOMMENDATIONS - Be cautious with alcohol, sedatives and other CNS depressants that may worsen sleep apnea and disrupt normal sleep architecture. - Sleep hygiene should be reviewed to assess factors that may improve sleep quality. - Weight management and regular exercise should be initiated or continued if appropriate.  [Electronically signed] 09/09/2016 03:21 PM  Baird Lyons MD, [place], American Board of Sleep Medicine   NPI: [id number]  [place], American Board of Sleep Medicine  ELECTRONICALLY SIGNED ON:  09/09/2016, 3:17 PM [place] PH: [phone]   FX: [phone] [place]

## 2016-09-22 ENCOUNTER — Encounter (HOSPITAL_BASED_OUTPATIENT_CLINIC_OR_DEPARTMENT_OTHER): Payer: Self-pay

## 2017-08-27 ENCOUNTER — Other Ambulatory Visit: Payer: Self-pay

## 2017-08-27 ENCOUNTER — Encounter: Payer: Self-pay | Admitting: Family Medicine

## 2017-08-27 ENCOUNTER — Ambulatory Visit: Payer: Self-pay | Admitting: Family Medicine

## 2017-08-27 VITALS — BP 102/68 | HR 91 | Temp 98.2°F | Ht 65.5 in | Wt 171.2 lb

## 2017-08-27 DIAGNOSIS — F3181 Bipolar II disorder: Secondary | ICD-10-CM

## 2017-08-27 DIAGNOSIS — E663 Overweight: Secondary | ICD-10-CM

## 2017-08-27 DIAGNOSIS — Z1159 Encounter for screening for other viral diseases: Secondary | ICD-10-CM

## 2017-08-27 DIAGNOSIS — E781 Pure hyperglyceridemia: Secondary | ICD-10-CM

## 2017-08-27 NOTE — Patient Instructions (Signed)
It was a pleasure to see you today! Thank you for choosing [facility] for your primary care. [name] was seen for new patient.   Our plans for today were:  Please call [facility] or [place] to be seen for your bipolar disorder.   Call the Behavioral Medicine center to schedule an appointment. Their phone number is: [phone].  I will message you with lab results.   Best,  Dr. Lindell Noe

## 2017-08-27 NOTE — Progress Notes (Signed)
   CC: new patient   HPI  Referred by:  Came here a long time ago as a patient   Medical history:   Alcohol use disorder 10-11 years ago, THC, cocaine, cigarettes quit all the same time.   Bipolar, anxiety, depression - bipolar 2 with mania ("they have tried them all" and "I'm really paranoid" cant go to [employer] by herself, takes the kids with her and can accomplish tasks. Hasn't been to psych at all recently. [facility] in the past.) no SI or HI at present. Binge eats when she is in manic phases.  Chronic pain - bilateral wrists and elbows, shoulders. L worse. Uses voltaren gel and 800mg  ibuprofen when having to lift more at work, works with lifting boxes at [employer].   Acid reflux - states "it doesn't go away" has tried both OTC and rx.   Incontinence - thinks likely 2/2 pregnancies, doesn't want bladder tacked.   Surgical history: BTL in 2013 with clip, then removed tubes in 2015  PAP smears - has been 1-2 years.   Social history:  Lives with: 4 kids at her house, 6 total Occupation: Public house manager  Tobacco use: former Alcohol use: former Drug use: former Sexually active - men   ROS: Denies CP, SOB, abdominal pain, dysuria, changes in BMs.   CC, SH/smoking status, and VS noted  Objective: BP 102/68   Pulse 91   Temp 98.2 F (36.8 C) (Oral)   Ht 5' 5.5" (1.664 m)   Wt 171 lb 3.2 oz (77.7 kg)   LMP 08/08/2017 (Approximate)   SpO2 98%   BMI 28.06 kg/m  Gen: NAD, alert, cooperative, and pleasant. HEENT: NCAT, EOMI, PERRL CV: RRR, no murmur Resp: CTAB, no wheezes, non-labored Ext: No edema, warm Neuro: Alert and oriented, Speech clear, No gross deficits  Assessment and plan:  Bipolar 2 disorder (HCC) Needs to get back in with psych, asked her to call [facility] and [place] [facility]. She is also working on financial assistance paperwork to make meds more affordable.   Hypertriglyceridemia Did not know about this in previous results. She would like to  recheck. Extensive family hx of CAD.    Orders Placed This Encounter  Procedures  . CBC  . Basic metabolic panel  . Lipid panel  . HIV antibody  . RPR    No orders of the defined types were placed in this encounter.  Health Maintenance: UTD  Return in 1 month to discuss joint pain.  Ralene Ok, MD, PGY3 08/28/2017 12:06 PM

## 2017-08-28 DIAGNOSIS — F3181 Bipolar II disorder: Secondary | ICD-10-CM | POA: Insufficient documentation

## 2017-08-28 LAB — BASIC METABOLIC PANEL
BUN/Creatinine Ratio: 14 (ref 9–23)
BUN: 9 mg/dL (ref 6–24)
CO2: 25 mmol/L (ref 20–29)
Calcium: 9.4 mg/dL (ref 8.7–10.2)
Chloride: 103 mmol/L (ref 96–106)
Creatinine, Ser: 0.64 mg/dL (ref 0.57–1.00)
GFR calc Af Amer: 129 mL/min/{1.73_m2} (ref >59)
GFR calc non Af Amer: 112 mL/min/{1.73_m2} (ref >59)
Glucose: 83 mg/dL (ref 65–99)
Potassium: 4.5 mmol/L (ref 3.5–5.2)
Sodium: 139 mmol/L (ref 134–144)

## 2017-08-28 LAB — LIPID PANEL
Chol/HDL Ratio: 4.3 ratio (ref 0.0–4.4)
Cholesterol, Total: 272 mg/dL — ABNORMAL HIGH (ref 100–199)
HDL: 64 mg/dL (ref >39)
LDL Calculated: 178 mg/dL — ABNORMAL HIGH (ref 0–99)
Triglycerides: 150 mg/dL — ABNORMAL HIGH (ref 0–149)
VLDL Cholesterol Cal: 30 mg/dL (ref 5–40)

## 2017-08-28 LAB — CBC
Hematocrit: 39.6 % (ref 34.0–46.6)
Hemoglobin: 13.4 g/dL (ref 11.1–15.9)
MCH: 28.5 pg (ref 26.6–33.0)
MCHC: 33.8 g/dL (ref 31.5–35.7)
MCV: 84 fL (ref 79–97)
Platelets: 335 10*3/uL (ref 150–450)
RBC: 4.7 x10E6/uL (ref 3.77–5.28)
RDW: 13.6 % (ref 12.3–15.4)
WBC: 7.3 10*3/uL (ref 3.4–10.8)

## 2017-08-28 LAB — HIV ANTIBODY (ROUTINE TESTING W REFLEX): HIV Screen 4th Generation wRfx: NONREACTIVE

## 2017-08-28 LAB — RPR: RPR Ser Ql: NONREACTIVE

## 2017-08-28 MED ORDER — ATORVASTATIN CALCIUM 40 MG PO TABS: 40.0000 mg | ORAL_TABLET | Freq: Every day | ORAL | 3 refills | Status: DC

## 2017-08-28 NOTE — Assessment & Plan Note (Signed)
Needs to get back in with psych, asked her to call [facility] and [place] [facility]. She is also working on financial assistance paperwork to make meds more affordable.

## 2017-08-28 NOTE — Assessment & Plan Note (Signed)
Did not know about this in previous results. She would like to recheck. Extensive family hx of CAD.

## 2017-09-24 ENCOUNTER — Ambulatory Visit (INDEPENDENT_AMBULATORY_CARE_PROVIDER_SITE_OTHER): Payer: Self-pay | Admitting: Family Medicine

## 2017-09-24 ENCOUNTER — Encounter: Payer: Self-pay | Admitting: Family Medicine

## 2017-09-24 ENCOUNTER — Other Ambulatory Visit: Payer: Self-pay

## 2017-09-24 VITALS — BP 100/70 | HR 81 | Temp 98.2°F | Ht 66.0 in | Wt 166.8 lb

## 2017-09-24 DIAGNOSIS — G5602 Carpal tunnel syndrome, left upper limb: Secondary | ICD-10-CM

## 2017-09-24 DIAGNOSIS — M7712 Lateral epicondylitis, left elbow: Secondary | ICD-10-CM

## 2017-09-24 DIAGNOSIS — G5603 Carpal tunnel syndrome, bilateral upper limbs: Secondary | ICD-10-CM

## 2017-09-24 MED ORDER — DICLOFENAC SODIUM 1 % TD GEL: 2.0000 g | Freq: Four times a day (QID) | TRANSDERMAL | 2 refills | Status: DC

## 2017-09-24 MED ORDER — IBUPROFEN 800 MG PO TABS: 800.0000 mg | ORAL_TABLET | Freq: Three times a day (TID) | ORAL | 0 refills | Status: DC | PRN

## 2017-09-24 NOTE — Progress Notes (Signed)
   CC: joint pain   HPI  Joint pain - more L wrist, elbow, shoulder. Wants voltaren gel as this has helped in the past. Uses 800mg  on the day that she knows she has to unload a heavy truck (about once per week) - works in stocking at [employer]. On regular days the gel works well. Doesn't want shots/joint injections. Told carpal tunnel in L wrist at [facility], wants [place] to get records. Overuse in L elbow and shoulder were previous diagnoses.   ROS: Denies CP, SOB, abdominal pain, dysuria, changes in BMs.   CC, SH/smoking status, and VS noted  Objective: BP 100/70   Pulse 81   Temp 98.2 F (36.8 C) (Oral)   Ht 5\' 6"  (1.676 m)   Wt 166 lb 12.8 oz (75.7 kg)   LMP 09/09/2017 (Approximate)   SpO2 99%   BMI 26.92 kg/m  Gen: NAD, alert, cooperative, and pleasant. HEENT: NCAT, EOMI, PERRL CV: RRR, no murmur Resp: CTAB, no wheezes, non-labored Abd: SNTND, BS present, no guarding or organomegaly Ext: No edema, warm. Pain with active ROM of L shoulder, +TTP over left lateral epicondyle, +phalens bilaterally.  Neuro: Alert and oriented, Speech clear, No gross deficits  Assessment and plan:  Carpal tunnel syndrome Patient with history of same per her report, +Phalens today. Ok to use voltaren gel and ibuprofen PRN. Consider splints vs changing job roles.   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 Can continue to use voltaren gel and ibuprofen PRN.    Meds ordered this encounter  Medications  . diclofenac sodium (VOLTAREN) 1 % GEL    Sig: Apply 2 g topically 4 (four) times daily.    Dispense:  100 g    Refill:  2  . ibuprofen (ADVIL,MOTRIN) 800 MG tablet    Sig: Take 1 tablet (800 mg total) by mouth every 8 (eight) hours as needed.    Dispense:  30 tablet    Refill:  0     Ralene Ok, MD, PGY3 09/28/2017 8:22 PM

## 2017-09-24 NOTE — Patient Instructions (Signed)
It was a pleasure to see you today! Thank you for choosing [facility] for your primary care. [name] was seen for joint pain.   Our plans for today were:  I have sent the refills, please don't use ibuprofen more than a 2 times per week.   Best,  Dr. Lindell Noe

## 2017-09-28 DIAGNOSIS — M7712 Lateral epicondylitis, left elbow: Secondary | ICD-10-CM | POA: Insufficient documentation

## 2017-09-28 DIAGNOSIS — G5600 Carpal tunnel syndrome, unspecified upper limb: Secondary | ICD-10-CM | POA: Insufficient documentation

## 2017-09-28 NOTE — Assessment & Plan Note (Signed)
Patient with history of same per her report, +Phalens today. Ok to use voltaren gel and ibuprofen PRN. Consider splints vs changing job roles.

## 2017-09-28 NOTE — Assessment & Plan Note (Signed)
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 Can continue to use voltaren gel and ibuprofen PRN.

## 2017-11-29 ENCOUNTER — Ambulatory Visit: Payer: Managed Care, Other (non HMO) | Admitting: Sports Medicine

## 2017-12-19 ENCOUNTER — Ambulatory Visit: Payer: Managed Care, Other (non HMO) | Admitting: Family Medicine

## 2018-02-04 ENCOUNTER — Emergency Department (HOSPITAL_COMMUNITY)
Admission: EM | Admit: 2018-02-04 | Discharge: 2018-02-04 | Disposition: A | Discharge status: Home / Self Care | Payer: Managed Care, Other (non HMO) | Attending: Emergency Medicine | Admitting: Emergency Medicine

## 2018-02-04 ENCOUNTER — Encounter: Payer: Self-pay | Admitting: Family Medicine

## 2018-02-04 ENCOUNTER — Encounter (HOSPITAL_COMMUNITY): Payer: Self-pay | Admitting: *Deleted

## 2018-02-04 DIAGNOSIS — Z87891 Personal history of nicotine dependence: Secondary | ICD-10-CM | POA: Insufficient documentation

## 2018-02-04 DIAGNOSIS — N898 Other specified noninflammatory disorders of vagina: Secondary | ICD-10-CM

## 2018-02-04 DIAGNOSIS — Z202 Contact with and (suspected) exposure to infections with a predominantly sexual mode of transmission: Secondary | ICD-10-CM | POA: Insufficient documentation

## 2018-02-04 DIAGNOSIS — Z711 Person with feared health complaint in whom no diagnosis is made: Secondary | ICD-10-CM

## 2018-02-04 LAB — URINALYSIS, ROUTINE W REFLEX MICROSCOPIC
Bilirubin Urine: NEGATIVE
Glucose, UA: NEGATIVE mg/dL
Hgb urine dipstick: NEGATIVE
Ketones, ur: NEGATIVE mg/dL
Nitrite: NEGATIVE
Protein, ur: NEGATIVE mg/dL
Specific Gravity, Urine: 1.019 (ref 1.005–1.030)
pH: 5 (ref 5.0–8.0)

## 2018-02-04 LAB — WET PREP, GENITAL
Clue Cells Wet Prep HPF POC: NONE SEEN
Sperm: NONE SEEN
Trich, Wet Prep: NONE SEEN
Yeast Wet Prep HPF POC: NONE SEEN

## 2018-02-04 LAB — PREGNANCY, URINE: Preg Test, Ur: NEGATIVE

## 2018-02-04 MED ORDER — AZITHROMYCIN 250 MG PO TABS
1000.0000 mg | ORAL_TABLET | Freq: Once | ORAL | Status: AC
  Administered 2018-02-04: 1000 mg via ORAL
  Filled 2018-02-04: qty 4

## 2018-02-04 MED ORDER — CEFTRIAXONE SODIUM 250 MG IJ SOLR
250.0000 mg | Freq: Once | INTRAMUSCULAR | Status: AC
  Administered 2018-02-04: 250 mg via INTRAMUSCULAR
  Filled 2018-02-04: qty 250

## 2018-02-04 MED ORDER — LIDOCAINE HCL (PF) 1 % IJ SOLN
INTRAMUSCULAR | Status: AC
  Filled 2018-02-04: qty 5

## 2018-02-04 NOTE — Discharge Instructions (Signed)
You have been treated today for an STD.   The test results with take 2-3 days to return. If there is an abnormal result, you will be notified. If you do not hear anything, that means the results were negative. You can also log on MyChart to see the results.   Your sexual partner needs to be treated too. Do not have sexual intercourse for the next 7 days and after your partner has been treated.   Follow-up with your primary care doctor in 2-4 days. If you do not have a primary care doctor, you can use one listed in the paperwork.   Return to the Emergency Department for any fever, abdominal pain, difficulty breathing, nausea/vomiting or any other worsening or concerning symptoms.    As we discussed, make sure you are keeping the vagina clean but you do not want to over clean it that can cause irritation to the skin.  You can apply Vaseline to help with itching.  Return emergency department for any fevers, abdominal pain, vaginal bleeding, vaginal discharge.

## 2018-02-04 NOTE — ED Provider Notes (Signed)
[facility] EMERGENCY DEPARTMENT Provider Note   CSN: [id number] Arrival date & time: 02/04/18  1719     History   Chief Complaint Chief Complaint  Patient presents with  . Vaginal Itching    HPI [name] is a 42 y.o. female past medical history of bipolar, generalized anxiety disorder who presents for evaluation of vaginal irritation and itching that is began today.  Patient reports that she has not noted any vaginal discharge or foul-smelling odor from the vagina.  She does report she has a history of BV and states that her symptoms feel similar.  Patient reports that her last period was approximately 01/21/18.  She does report that she is currently sexually active with more than one partner.  She states that sometimes use protection sometimes they do not.  Patient has not had any vaginal bleeding or fevers, urinary complaints.  The history is provided by the patient.    Past Medical History:  Diagnosis Date  . Depression with anxiety   . Post partum depression 07/20/2011  . Postpartum hemorrhage    2nd pregnancy  . UTI (lower urinary tract infection)    history of     Patient Active Problem List   Diagnosis Date Noted  . Carpal tunnel syndrome 09/28/2017  . Lateral epicondylitis of left elbow 09/28/2017  . Bipolar 2 disorder ([place]) 08/28/2017  . Mood disorder ([place]) 02/04/2015  . Skin tag 06/25/2012  . Mole of skin 06/25/2012  . Hypertriglyceridemia 05/22/2012  . Overweight (BMI 25.0-29.9) 04/29/2012  . Generalized anxiety disorder 03/28/2010    Past Surgical History:  Procedure Laterality Date  . LAPAROSCOPIC BILATERAL SALPINGECTOMY  01/19/2015   Procedure: LAPAROSCOPIC BILATERAL SALPINGECTOMY with Sharmon Leyden Clips;  Surgeon: Woodroe Mode, MD;  Location: [facility];  Service: Gynecology;;  . LAPAROSCOPIC LYSIS OF ADHESIONS  01/19/2015   Procedure: LAPAROSCOPIC LYSIS OF ADHESIONS;  Surgeon: Woodroe Mode, MD;  Location: [facility];  Service:  Gynecology;;  . LAPAROSCOPY Bilateral 01/19/2015   Procedure: DIAGNOSTIC LAPAROSCOPY;  Surgeon: Woodroe Mode, MD;  Location: [facility];  Service: Gynecology;  Laterality: Bilateral;  . TUBAL LIGATION  02/27/2012   Procedure: POST PARTUM TUBAL LIGATION;  Surgeon: Woodroe Mode, MD;  Location: [facility];  Service: Gynecology;  Laterality: Bilateral;     OB History    Gravida  9   Para  5   Term  5   Preterm  0   AB  4   Living  5     SAB      TAB  4   Ectopic      Multiple      Live Births  5            Home Medications    Prior to Admission medications   Medication Sig Start Date End Date Taking? Authorizing Provider  atorvastatin (LIPITOR) 40 MG tablet Take 1 tablet (40 mg total) by mouth daily. Patient not taking: Reported on 02/04/2018 08/28/17   Sela Hilding, MD    Family History Family History  Problem Relation Age of Onset  . Drug abuse Maternal Grandmother   . Diabetes Father   . Cancer Father        prostate  . Cancer Maternal Grandmother        throat, brain  . Stroke Maternal Grandfather   . Stroke Maternal Aunt   . CAD Maternal Uncle   . Diabetes Maternal Aunt   . Cancer Other  stomach, pancreas (maternal)    Social History Social History   Tobacco Use  . Smoking status: Former Smoker    Last attempt to quit: 04/30/2006    Years since quitting: 11.7  . Smokeless tobacco: Never Used  Substance Use Topics  . Alcohol use: No    Alcohol/week: 0.0 standard drinks    Comment: h/o alcohol abuse  . Drug use: No    Comment: h/o MJ use     Allergies   Patient has no known allergies.   Review of Systems Review of Systems  Constitutional: Negative for fever.  Genitourinary: Negative for dysuria and hematuria.       Vaginal irritation  All other systems reviewed and are negative.    Physical Exam Updated Vital Signs BP 110/81 (BP Location: Right Arm)   Pulse 99   Temp 98.4 F (36.9 C) (Oral)   Resp 16   SpO2 100%    Physical Exam  Constitutional: She appears well-developed and well-nourished.  HENT:  Head: Normocephalic and atraumatic.  Eyes: Conjunctivae and EOM are normal. Right eye exhibits no discharge. Left eye exhibits no discharge. No scleral icterus.  Pulmonary/Chest: Effort normal.  Genitourinary:    Cervix exhibits no motion tenderness, no discharge and no friability. Right adnexum displays no mass and no tenderness. Left adnexum displays no mass and no tenderness. There is bleeding in the vagina.  Genitourinary Comments: The exam was performed with a chaperone present. Normal external female genitalia. No lesions, rash.  There is some white vaginal discharge noted in the vault as well as from the cervix.  No active hemorrhaging, of active vaginal bleeding noted in the vaginal vault.  No CMT.  No adnexal mass or tenderness bilaterally.  Neurological: She is alert.  Skin: Skin is warm and dry.  Psychiatric: She has a normal mood and affect. Her speech is normal and behavior is normal.  Nursing note and vitals reviewed.    ED Treatments / Results  Labs (all labs ordered are listed, but only abnormal results are displayed) Labs Reviewed  WET PREP, GENITAL - Abnormal; Notable for the following components:      Result Value   WBC, Wet Prep HPF POC MODERATE (*)    All other components within normal limits  URINALYSIS, ROUTINE W REFLEX MICROSCOPIC - Abnormal; Notable for the following components:   Leukocytes, UA SMALL (*)    Bacteria, UA RARE (*)    All other components within normal limits  PREGNANCY, URINE  GC/CHLAMYDIA PROBE AMP ([place]) NOT AT [facility]    EKG None  Radiology No results found.  Procedures Procedures (including critical care time)  Medications Ordered in ED Medications  lidocaine (PF) (XYLOCAINE) 1 % injection (has no administration in time range)  cefTRIAXone (ROCEPHIN) injection 250 mg (250 mg Intramuscular Given 02/04/18 2138)  azithromycin (ZITHROMAX)  tablet 1,000 mg (1,000 mg Oral Given 02/04/18 2139)     Initial Impression / Assessment and Plan / ED Course  I have reviewed the triage vital signs and the nursing notes.  Pertinent labs & imaging results that were available during my care of the patient were reviewed by me and considered in my medical decision making (see chart for details).     42 year old female who presents for evaluation of vaginal itching that began today.  No discharge, urinary complaints.  Reports  history of BV and states that this feels similar to previous BV. Patient is afebrile, non-toxic appearing, sitting comfortably on examination table.  GU  exam as documented above.  No CMT that would be concerning for PID.  Patient did have a small area of skin irritation on the outer right side of the vagina.  She reports that she does wipe and clean excessively and it appears that this is an area where the skin has been irritated.  No laceration, abrasions.  No bleeding in the vaginal vault.  She did have cervical discharge.  Discussed with patient regarding testing for GC/chlamydia.  Patient does report that she has more than 1 sexual partner.  I discussed treatment options with patient.  Patient reports that she would like to go ahead and and receive antibiotics for possible treatment of GC/chlamydia today.  Patient with no known drug allergies.  Urine pregnancy negative.  UA shows small leukocytes, small amount of pyuria.  Patient with no urinary complaints.  Wet prep shows no clue cells.  She does have moderate white blood cells.  No yeast.  Discussed results with patient.  Symptoms likely result of vaginitis.  Additionally, I suspect that excessive wiping and cleaning may have caused the skin to be irritated.  No laceration or sores seen.  Encourage at home supportive care measures. Patient had ample opportunity for questions and discussion. All patient's questions were answered with full understanding. Strict return  precautions discussed. Patient expresses understanding and agreement to plan.   Final Clinical Impressions(s) / ED Diagnoses   Final diagnoses:  Vaginal irritation  Concern about STD in female without diagnosis    ED Discharge Orders    None       Desma Mcgregor 02/04/18 2218    Fredia Sorrow, MD 02/05/18 0001

## 2018-02-04 NOTE — ED Triage Notes (Signed)
Pt in c/o vaginal discharge, denies discharge or odor, history of BV and this feels similar

## 2018-02-05 LAB — GC/CHLAMYDIA PROBE AMP (~~LOC~~) NOT AT ARMC
Chlamydia: NEGATIVE
Neisseria Gonorrhea: NEGATIVE

## 2018-07-12 ENCOUNTER — Encounter: Payer: Self-pay | Admitting: Family Medicine

## 2018-08-15 ENCOUNTER — Encounter: Payer: Self-pay | Admitting: Family Medicine

## 2018-08-21 ENCOUNTER — Encounter: Payer: Managed Care, Other (non HMO) | Admitting: Family Medicine

## 2018-08-29 ENCOUNTER — Ambulatory Visit
Admission: EM | Admit: 2018-08-29 | Discharge: 2018-08-29 | Disposition: A | Discharge status: Home / Self Care | Payer: Managed Care, Other (non HMO) | Attending: Physician Assistant | Admitting: Physician Assistant

## 2018-08-29 ENCOUNTER — Other Ambulatory Visit: Payer: Self-pay

## 2018-08-29 DIAGNOSIS — B9689 Other specified bacterial agents as the cause of diseases classified elsewhere: Secondary | ICD-10-CM

## 2018-08-29 DIAGNOSIS — N3090 Cystitis, unspecified without hematuria: Secondary | ICD-10-CM | POA: Diagnosis not present

## 2018-08-29 LAB — POCT URINALYSIS DIP (MANUAL ENTRY)
Bilirubin, UA: NEGATIVE
Glucose, UA: NEGATIVE mg/dL
Ketones, POC UA: NEGATIVE mg/dL
Nitrite, UA: NEGATIVE
Protein Ur, POC: 100 mg/dL — AB
Spec Grav, UA: 1.025 (ref 1.010–1.025)
Urobilinogen, UA: 1 E.U./dL
pH, UA: 7 (ref 5.0–8.0)

## 2018-08-29 MED ORDER — CEPHALEXIN 500 MG PO CAPS: 500.0000 mg | ORAL_CAPSULE | Freq: Two times a day (BID) | ORAL | 0 refills | Status: DC

## 2018-08-29 MED ORDER — FLUCONAZOLE 150 MG PO TABS: 150.0000 mg | ORAL_TABLET | Freq: Every day | ORAL | 0 refills | Status: DC

## 2018-08-29 NOTE — ED Provider Notes (Signed)
[facility]    CSN: [id number] Arrival date & time: 08/29/18  1040     History   Chief Complaint Chief Complaint  Patient presents with  . Urinary Tract Infection    HPI [name] is a 43 y.o. female.   43 year old female comes in for few day history of urinary symptoms.  She is having dysuria, urinary spasms.  Denies frequency, hematuria.  Denies abdominal pain, nausea, vomiting.  Denies vaginal symptoms such as discharge, itching, bleeding. She denies any fever, chills, night sweats.  LMP 08/17/2018.  Sexually active with one female partner, no condom use.  Patient states has had frequent urinary tract infections growing up, and symptoms mostly consist of spasms that is worse after urination.  This is usually treated with Pyridium with good relief.  However, at this time she has not had improvement of symptoms with Pyridium.     Past Medical History:  Diagnosis Date  . Depression with anxiety   . Post partum depression 07/20/2011  . Postpartum hemorrhage    2nd pregnancy  . UTI (lower urinary tract infection)    history of     Patient Active Problem List   Diagnosis Date Noted  . Carpal tunnel syndrome 09/28/2017  . Lateral epicondylitis of left elbow 09/28/2017  . Bipolar 2 disorder ([place]) 08/28/2017  . Mood disorder ([place]) 02/04/2015  . Skin tag 06/25/2012  . Mole of skin 06/25/2012  . Hypertriglyceridemia 05/22/2012  . Overweight (BMI 25.0-29.9) 04/29/2012  . Generalized anxiety disorder 03/28/2010    Past Surgical History:  Procedure Laterality Date  . LAPAROSCOPIC BILATERAL SALPINGECTOMY  01/19/2015   Procedure: LAPAROSCOPIC BILATERAL SALPINGECTOMY with Sharmon Leyden Clips;  Surgeon: Woodroe Mode, MD;  Location: [facility];  Service: Gynecology;;  . LAPAROSCOPIC LYSIS OF ADHESIONS  01/19/2015   Procedure: LAPAROSCOPIC LYSIS OF ADHESIONS;  Surgeon: Woodroe Mode, MD;  Location: [facility];  Service: Gynecology;;  . LAPAROSCOPY Bilateral 01/19/2015   Procedure: DIAGNOSTIC LAPAROSCOPY;  Surgeon: Woodroe Mode, MD;  Location: [facility];  Service: Gynecology;  Laterality: Bilateral;  . TUBAL LIGATION  02/27/2012   Procedure: POST PARTUM TUBAL LIGATION;  Surgeon: Woodroe Mode, MD;  Location: [facility];  Service: Gynecology;  Laterality: Bilateral;    OB History    Gravida  9   Para  5   Term  5   Preterm  0   AB  4   Living  5     SAB      TAB  4   Ectopic      Multiple      Live Births  5            Home Medications    Prior to Admission medications   Medication Sig Start Date End Date Taking? Authorizing Provider  atorvastatin (LIPITOR) 40 MG tablet Take 1 tablet (40 mg total) by mouth daily. Patient not taking: Reported on 02/04/2018 08/28/17   Sela Hilding, MD  cephALEXin (KEFLEX) 500 MG capsule Take 1 capsule (500 mg total) by mouth 2 (two) times daily. 08/29/18   Tasia Catchings, Ginia Rudell V, PA-C  fluconazole (DIFLUCAN) 150 MG tablet Take 1 tablet (150 mg total) by mouth daily. Take second dose 72 hours later if symptoms still persists. 08/29/18   Ok Edwards, PA-C    Family History Family History  Problem Relation Age of Onset  . Drug abuse Maternal Grandmother   . Cancer Maternal Grandmother        throat, brain  .  Diabetes Father   . Cancer Father        prostate  . Stroke Maternal Grandfather   . Stroke Maternal Aunt   . CAD Maternal Uncle   . Diabetes Maternal Aunt   . Cancer Other        stomach, pancreas (maternal)    Social History Social History   Tobacco Use  . Smoking status: Former Smoker    Quit date: 04/30/2006    Years since quitting: 12.3  . Smokeless tobacco: Never Used  Substance Use Topics  . Alcohol use: No    Alcohol/week: 0.0 standard drinks    Comment: h/o alcohol abuse  . Drug use: No    Comment: h/o MJ use     Allergies   Patient has no known allergies.   Review of Systems Review of Systems  Reason unable to perform ROS: See HPI as above.     Physical Exam Triage Vital  Signs ED Triage Vitals  Enc Vitals Group     BP 08/29/18 1049 110/85     Pulse Rate 08/29/18 1049 (!) 108     Resp 08/29/18 1049 18     Temp 08/29/18 1049 97.9 F (36.6 C)     Temp Source 08/29/18 1049 Oral     SpO2 --      Weight --      Height --      Head Circumference --      Peak Flow --      Pain Score 08/29/18 1050 7     Pain Loc --      Pain Edu? --      Excl. in [place]? --    No data found.  Updated Vital Signs BP 110/85 (BP Location: Left Arm)   Pulse (!) 108   Temp 97.9 F (36.6 C) (Oral)   Resp 18   LMP 08/17/2018   Physical Exam Constitutional:      General: She is not in acute distress.    Appearance: She is well-developed. She is not ill-appearing, toxic-appearing or diaphoretic.  HENT:     Head: Normocephalic and atraumatic.  Eyes:     Conjunctiva/sclera: Conjunctivae normal.     Pupils: Pupils are equal, round, and reactive to light.  Cardiovascular:     Rate and Rhythm: Normal rate and regular rhythm.     Heart sounds: Normal heart sounds. No murmur. No friction rub. No gallop.   Pulmonary:     Effort: Pulmonary effort is normal.     Breath sounds: Normal breath sounds. No wheezing or rales.  Abdominal:     General: Bowel sounds are normal.     Palpations: Abdomen is soft.     Tenderness: There is no abdominal tenderness. There is right CVA tenderness and left CVA tenderness. There is no guarding or rebound.  Skin:    General: Skin is warm and dry.  Neurological:     Mental Status: She is alert and oriented to person, place, and time.  Psychiatric:        Behavior: Behavior normal.        Judgment: Judgment normal.      UC Treatments / Results  Labs (all labs ordered are listed, but only abnormal results are displayed) Labs Reviewed  POCT URINALYSIS DIP (MANUAL ENTRY) - Abnormal; Notable for the following components:      Result Value   Clarity, UA cloudy (*)    Blood, UA small (*)    Protein Ur, POC =  100 (*)    Leukocytes, UA Trace  (*)    All other components within normal limits  URINE CULTURE    EKG   Radiology No results found.  Procedures Procedures (including critical care time)  Medications Ordered in UC Medications - No data to display  Initial Impression / Assessment and Plan / UC Course  I have reviewed the triage vital signs and the nursing notes.  Pertinent labs & imaging results that were available during my care of the patient were reviewed by me and considered in my medical decision making (see chart for details).    Urine dipstick positive for trace leuks, small blood.  Patient has bilateral CVA tenderness, but without fever, chills, body aches, abdominal pain, nausea, vomiting.  Discussed possibility of kidney stones causing symptoms as well.  Will provide Keflex to treat for urinary tract infection, but will extend course to 7 days to cover for possible emerging pyelonephritis. Push fluids. Return precautions given.  Patient expresses understanding and agrees to plan.  Final Clinical Impressions(s) / UC Diagnoses   Final diagnoses:  Cystitis    ED Prescriptions    Medication Sig Dispense Auth. Provider   cephALEXin (KEFLEX) 500 MG capsule Take 1 capsule (500 mg total) by mouth 2 (two) times daily. 14 capsule Fountain Derusha V, PA-C   fluconazole (DIFLUCAN) 150 MG tablet Take 1 tablet (150 mg total) by mouth daily. Take second dose 72 hours later if symptoms still persists. 2 tablet Tobin Chad, PA-C 08/29/18 1120

## 2018-08-29 NOTE — Discharge Instructions (Signed)
Your urine was positive for an urinary tract infection. Start keflex as directed. Diflucan if having yeast symptoms. Keep hydrated, urine should be clear to pale yellow in color. Monitor for any worsening of symptoms, fever, worsening abdominal pain, nausea/vomiting, flank pain, follow up for reevaluation.

## 2018-08-29 NOTE — ED Triage Notes (Signed)
Pt c/o urinary difficulty with spasms x4 days

## 2018-08-31 LAB — URINE CULTURE: Culture: >=100000 — AB

## 2018-09-02 ENCOUNTER — Telehealth (HOSPITAL_COMMUNITY): Payer: Self-pay | Admitting: Emergency Medicine

## 2018-09-02 NOTE — Telephone Encounter (Signed)
Urine culture was positive for STAPHYLOCOCCUS SAPROPHYTICUS and was given  keflex at urgent care visit. Attempted to reach patient. No answer at this time.

## 2019-03-27 ENCOUNTER — Other Ambulatory Visit: Payer: Self-pay

## 2019-05-28 ENCOUNTER — Encounter: Payer: Self-pay | Admitting: Family Medicine

## 2019-06-16 ENCOUNTER — Ambulatory Visit: Payer: Self-pay | Admitting: Family Medicine

## 2019-07-11 ENCOUNTER — Ambulatory Visit (INDEPENDENT_AMBULATORY_CARE_PROVIDER_SITE_OTHER): Payer: Self-pay | Admitting: Family Medicine

## 2019-07-11 ENCOUNTER — Other Ambulatory Visit: Payer: Self-pay

## 2019-07-11 VITALS — BP 102/62 | HR 71 | Ht 66.0 in

## 2019-07-11 DIAGNOSIS — Z5989 Other problems related to housing and economic circumstances: Secondary | ICD-10-CM

## 2019-07-11 DIAGNOSIS — M25521 Pain in right elbow: Secondary | ICD-10-CM | POA: Insufficient documentation

## 2019-07-11 DIAGNOSIS — Z8249 Family history of ischemic heart disease and other diseases of the circulatory system: Secondary | ICD-10-CM | POA: Insufficient documentation

## 2019-07-11 DIAGNOSIS — F3181 Bipolar II disorder: Secondary | ICD-10-CM

## 2019-07-11 DIAGNOSIS — Z598 Other problems related to housing and economic circumstances: Secondary | ICD-10-CM

## 2019-07-11 DIAGNOSIS — E785 Hyperlipidemia, unspecified: Secondary | ICD-10-CM

## 2019-07-11 NOTE — Patient Instructions (Signed)
Thank you for coming to see me today. It was a pleasure. Today we talked about:   We will get blood work.    I have placed a referral to Hematology for possible blood clotting disorder.  If you do not hear from them in the next 2 weeks, please give [place] a call.  Please follow-up with me in 3 months.  If you have any questions or concerns, please do not hesitate to call the office at [phone].  Best,   [place] Constable, DO

## 2019-07-11 NOTE — Assessment & Plan Note (Addendum)
Patient encouraged to work on getting assistance with insurance and medical cost as it is important that she is seen by psychiatry.  She declines treatment at this time. No SI.  We will continue to assess further follow-up.

## 2019-07-11 NOTE — Assessment & Plan Note (Signed)
Complicating patient healthcare treatment.  Patient given paper on filing for resources and advised to ask for orange card information upon leaving.

## 2019-07-11 NOTE — Assessment & Plan Note (Addendum)
Patient declines statin at this time.  Would like to get a lipid panel to ensure that her cholesterol is improved from previously.  Advised her that would likely end up recommending a statin.  She is also requesting a BMP and CBC today.  We will order these.

## 2019-07-11 NOTE — Assessment & Plan Note (Signed)
Discussed with patient that this is most likely an overuse injury and agreed with prior assessment.  She states that she is used braces in the past without improvement.  She was worried about ibuprofen use, but advised her that such a small dose daily would be fine.  She can continue to do this.

## 2019-07-11 NOTE — Assessment & Plan Note (Signed)
States that patient may have a history of protein S deficiency.  Possibly 2-3 first-degree relatives.  Given that she does not have insurance, would recommend that she follows up with hematology to further discuss this and see if she needs to be tested.  Referral placed.

## 2019-07-11 NOTE — Progress Notes (Signed)
SUBJECTIVE:   CHIEF COMPLAINT / HPI:   Family History of Clotting Patient reports that her two sisters, who she thinks that have differnet fathers, have a clotting disorder  She states that one sister had clots in pregnancy and the other broke her ankle, took a flight, and then had clots Patient has had 12 pregnancies, delivered 5 children, no personal history of clotting Patient's mother is pushing her to be tested for this Patient doesn't have insurance and cost worries her about testing Thinks that her sister that died had Protein S deficiency, but her other sister says that she doesn't have this Patient is also requesting that she have a BMP and CBC performed today  HLD Patient has been told cholesterol was very high in the past, but she has not been taking anything She was prescribed atorvastatin 40 in 2019 but never took it  Right Elbow Pain At the end of the visit notes that she has been told she has tennis elbow on the right and is wondering if she can try anything else for the pain Given that she does have insurance, she is not open to injections or physical therapy States that she has done some exercises and use brace in the past without improvement States that the pain improves with taking 1 ibuprofen daily, but she is worried that this will affect her kidneys.  For this reason, she would like to have a BMP performed today.  History of bipolar disorder Patient reports a history of bipolar disorder with mania Has not been on therapy for quite some time States that she feels like she can deal with it Declines treatment at this time   Office Visit from 07/11/2019 in [place]  PHQ-9 Total Score  10      PERTINENT  PMH / PSH: History of bipolar disorder, anxiety  OBJECTIVE:   BP 102/62   Pulse 71   Ht 5\' 6"  (1.676 m)   LMP 07/11/2019   SpO2 97%   BMI 26.92 kg/m    Physical Exam:  General: 44 y.o. female in NAD Cardio: RRR no  m/r/g Lungs: CTAB, no wheezing, no rhonchi, no crackles, no IWOB on RA Skin: warm and dry  Limited Right Elbow Exam: - Inspection: no obvious deformity. No swelling, erythema or bruising b/l - Palpation: TTP right lateral epicondyle and ober radial tunnel on right, no TTP left - ROM: full active ROM in flexion and extension. No crepitus - Strength: 5/5 strength in wrist flexion and extension, pain with wrist extension on right and resisted supination on right. 5/5 strength in biceps, triceps. - Neuro: NV intact distally - Special testing: no laxity with varus/valgus stress, negative milking maneuver.    ASSESSMENT/PLAN:   Family history of thrombosis States that patient may have a history of protein S deficiency.  Possibly 2-3 first-degree relatives.  Given that she does not have insurance, would recommend that she follows up with hematology to further discuss this and see if she needs to be tested.  Referral placed.  Hyperlipidemia Patient declines statin at this time.  Would like to get a lipid panel to ensure that her cholesterol is improved from previously.  Advised her that would likely end up recommending a statin.  She is also requesting a BMP and CBC today.  We will order these.  Right elbow pain Discussed with patient that this is most likely an overuse injury and agreed with prior assessment.  She states that she  is used braces in the past without improvement.  She was worried about ibuprofen use, but advised her that such a small dose daily would be fine.  She can continue to do this.  Does not have health insurance Complicating patient healthcare treatment.  Patient given paper on filing for resources and advised to ask for orange card information upon leaving.  Bipolar 2 disorder [facility]) Patient encouraged to work on getting assistance with insurance and medical cost as it is important that she is seen by psychiatry.  She declines treatment at this time. No SI.  We will  continue to assess further follow-up.     Cleophas Dunker, [place]

## 2019-07-12 LAB — BASIC METABOLIC PANEL
BUN/Creatinine Ratio: 27 — ABNORMAL HIGH (ref 9–23)
BUN: 19 mg/dL (ref 6–24)
CO2: 22 mmol/L (ref 20–29)
Calcium: 9.2 mg/dL (ref 8.7–10.2)
Chloride: 105 mmol/L (ref 96–106)
Creatinine, Ser: 0.7 mg/dL (ref 0.57–1.00)
GFR calc Af Amer: 124 mL/min/{1.73_m2} (ref >59)
GFR calc non Af Amer: 108 mL/min/{1.73_m2} (ref >59)
Glucose: 90 mg/dL (ref 65–99)
Potassium: 4.1 mmol/L (ref 3.5–5.2)
Sodium: 140 mmol/L (ref 134–144)

## 2019-07-12 LAB — CBC
Hematocrit: 38.1 % (ref 34.0–46.6)
Hemoglobin: 13.4 g/dL (ref 11.1–15.9)
MCH: 30.2 pg (ref 26.6–33.0)
MCHC: 35.2 g/dL (ref 31.5–35.7)
MCV: 86 fL (ref 79–97)
Platelets: 299 10*3/uL (ref 150–450)
RBC: 4.43 x10E6/uL (ref 3.77–5.28)
RDW: 13 % (ref 11.7–15.4)
WBC: 5.8 10*3/uL (ref 3.4–10.8)

## 2019-07-12 LAB — LIPID PANEL
Chol/HDL Ratio: 5.1 ratio — ABNORMAL HIGH (ref 0.0–4.4)
Cholesterol, Total: 266 mg/dL — ABNORMAL HIGH (ref 100–199)
HDL: 52 mg/dL (ref >39)
LDL Chol Calc (NIH): 179 mg/dL — ABNORMAL HIGH (ref 0–99)
Triglycerides: 188 mg/dL — ABNORMAL HIGH (ref 0–149)
VLDL Cholesterol Cal: 35 mg/dL (ref 5–40)

## 2019-07-15 ENCOUNTER — Encounter: Payer: Self-pay | Admitting: Family Medicine

## 2019-07-15 NOTE — Progress Notes (Signed)
Letter sent.

## 2019-07-16 ENCOUNTER — Encounter: Payer: Self-pay | Admitting: Family Medicine

## 2019-09-19 ENCOUNTER — Encounter: Payer: Self-pay | Admitting: Family Medicine

## 2020-03-11 ENCOUNTER — Encounter (HOSPITAL_COMMUNITY): Payer: Self-pay

## 2020-03-11 ENCOUNTER — Ambulatory Visit (HOSPITAL_COMMUNITY)
Admission: EM | Admit: 2020-03-11 | Discharge: 2020-03-11 | Disposition: A | Discharge status: Home / Self Care | Payer: Managed Care, Other (non HMO) | Attending: Family Medicine | Admitting: Family Medicine

## 2020-03-11 ENCOUNTER — Other Ambulatory Visit: Payer: Self-pay

## 2020-03-11 DIAGNOSIS — J069 Acute upper respiratory infection, unspecified: Secondary | ICD-10-CM | POA: Diagnosis present

## 2020-03-11 DIAGNOSIS — Z20822 Contact with and (suspected) exposure to covid-19: Secondary | ICD-10-CM

## 2020-03-11 LAB — SARS CORONAVIRUS 2 (TAT 6-24 HRS): SARS Coronavirus 2: NEGATIVE

## 2020-03-11 NOTE — ED Triage Notes (Signed)
Pt in with c/o headache, loss of taste, congestion, and runny nose that has been going on for a few days  Pt has taken ibuprofen with no relief

## 2020-03-11 NOTE — ED Provider Notes (Signed)
[place]    CSN: [id number] Arrival date & time: 03/11/20  [id number]      History   Chief Complaint Chief Complaint  Patient presents with  . Headache  . Nasal Congestion  . loss of taste    HPI [name] is a 45 y.o. female.   HPI   Patient states that she has had COVID in the past.  She is COVID vaccinated.  In spite of this she has been sick for 2 days.  She states that people at work have [place] and do not wear masks.  She feels like this is where she may have been exposed.  She has had a headache for 2 days.  Some nasal congestion.  Last night started having chills.  Fatigue.  No cough or shortness of breath.  She does have loss of taste  Past Medical History:  Diagnosis Date  . Depression with anxiety   . Post partum depression 07/20/2011  . Postpartum hemorrhage    2nd pregnancy  . UTI (lower urinary tract infection)    history of     Patient Active Problem List   Diagnosis Date Noted  . Family history of thrombosis 07/11/2019  . Right elbow pain 07/11/2019  . Does not have health insurance 07/11/2019  . Carpal tunnel syndrome 09/28/2017  . Lateral epicondylitis of left elbow 09/28/2017  . Bipolar 2 disorder ([place]) 08/28/2017  . Mood disorder ([place]) 02/04/2015  . Skin tag 06/25/2012  . Mole of skin 06/25/2012  . Hyperlipidemia 05/22/2012  . Overweight (BMI 25.0-29.9) 04/29/2012  . Generalized anxiety disorder 03/28/2010    Past Surgical History:  Procedure Laterality Date  . LAPAROSCOPIC BILATERAL SALPINGECTOMY  01/19/2015   Procedure: LAPAROSCOPIC BILATERAL SALPINGECTOMY with Sharmon Leyden Clips;  Surgeon: Woodroe Mode, MD;  Location: [facility];  Service: Gynecology;;  . LAPAROSCOPIC LYSIS OF ADHESIONS  01/19/2015   Procedure: LAPAROSCOPIC LYSIS OF ADHESIONS;  Surgeon: Woodroe Mode, MD;  Location: [facility];  Service: Gynecology;;  . LAPAROSCOPY Bilateral 01/19/2015   Procedure: DIAGNOSTIC LAPAROSCOPY;  Surgeon: Woodroe Mode, MD;  Location: [facility];   Service: Gynecology;  Laterality: Bilateral;  . TUBAL LIGATION  02/27/2012   Procedure: POST PARTUM TUBAL LIGATION;  Surgeon: Woodroe Mode, MD;  Location: [facility];  Service: Gynecology;  Laterality: Bilateral;    OB History    Gravida  9   Para  5   Term  5   Preterm  0   AB  4   Living  5     SAB      IAB  4   Ectopic      Multiple      Live Births  5            Home Medications    Prior to Admission medications   Medication Sig Start Date End Date Taking? Authorizing Provider  atorvastatin (LIPITOR) 40 MG tablet Take 1 tablet (40 mg total) by mouth daily. Patient not taking: Reported on 02/04/2018 08/28/17 03/11/20  Glenis Smoker, MD    Family History Family History  Problem Relation Age of Onset  . Drug abuse Maternal Grandmother   . Cancer Maternal Grandmother        throat, brain  . Diabetes Father   . Cancer Father        prostate  . Stroke Maternal Grandfather   . Stroke Maternal Aunt   . CAD Maternal Uncle   . Diabetes Maternal Aunt   .  Cancer Other        stomach, pancreas (maternal)    Social History Social History   Tobacco Use  . Smoking status: Former Smoker    Quit date: 04/30/2006    Years since quitting: 13.8  . Smokeless tobacco: Never Used  Substance Use Topics  . Alcohol use: No    Alcohol/week: 0.0 standard drinks    Comment: h/o alcohol abuse  . Drug use: No    Comment: h/o MJ use     Allergies   Patient has no known allergies.   Review of Systems Review of Systems See HPI  Physical Exam Triage Vital Signs ED Triage Vitals  Enc Vitals Group     BP 03/11/20 0850 123/89     Pulse Rate 03/11/20 0850 (!) 103     Resp 03/11/20 0850 20     Temp 03/11/20 0850 98.6 F (37 C)     Temp src --      SpO2 03/11/20 0850 100 %     Weight --      Height --      Head Circumference --      Peak Flow --      Pain Score 03/11/20 0849 6     Pain Loc --      Pain Edu? --      Excl. in [place]? --    No data  found.  Updated Vital Signs BP 123/89   Pulse (!) 103   Temp 98.6 F (37 C)   Resp 20   LMP 02/28/2020 (Approximate)   SpO2 100%      Physical Exam Constitutional:      General: She is not in acute distress.    Appearance: She is well-developed, normal weight and well-nourished.     Comments: Appears tired  HENT:     Head: Normocephalic and atraumatic.     Nose: Congestion present.     Mouth/Throat:     Mouth: Oropharynx is clear and moist. Mucous membranes are moist.     Pharynx: No posterior oropharyngeal erythema.  Eyes:     Conjunctiva/sclera: Conjunctivae normal.     Pupils: Pupils are equal, round, and reactive to light.  Cardiovascular:     Rate and Rhythm: Tachycardia present.     Heart sounds: Normal heart sounds.  Pulmonary:     Effort: Pulmonary effort is normal. No respiratory distress.     Breath sounds: Normal breath sounds.  Abdominal:     General: There is no distension.     Palpations: Abdomen is soft.  Musculoskeletal:        General: No edema. Normal range of motion.     Cervical back: Normal range of motion.  Skin:    General: Skin is warm and dry.  Neurological:     Mental Status: She is alert.  Psychiatric:        Behavior: Behavior normal.      UC Treatments / Results  Labs (all labs ordered are listed, but only abnormal results are displayed) Labs Reviewed - No data to display  EKG   Radiology No results found.  Procedures Procedures (including critical care time)  Medications Ordered in UC Medications - No data to display  Initial Impression / Assessment and Plan / UC Course  I have reviewed the triage vital signs and the nursing notes.  Pertinent labs & imaging results that were available during my care of the patient were reviewed by me and considered in my  medical decision making (see chart for details).     Reviewed importance of quarantine.  Symptomatic care.  Reviewed the current CDC guidelines for isolation for 5  days with mask wearing for additional 5 days if positive Final Clinical Impressions(s) / UC Diagnoses   Final diagnoses:  Contact with and (suspected) exposure to covid-19  Viral upper respiratory tract infection     Discharge Instructions     Go home to rest Drink plenty of fluids Take Tylenol for pain or fever You may take over-the-counter cough and cold medicines as needed You must quarantine at home until your test result is available You can check for your test result in MyChart    ED Prescriptions    None     PDMP not reviewed this encounter.   Raylene Everts, MD 03/11/20 [id number]

## 2020-03-11 NOTE — Discharge Instructions (Signed)
Go home to rest Drink plenty of fluids Take Tylenol for pain or fever You may take over-the-counter cough and cold medicines as needed You must quarantine at home until your test result is available You can check for your test result in MyChart  

## 2020-04-05 ENCOUNTER — Other Ambulatory Visit (HOSPITAL_COMMUNITY)
Admission: RE | Admit: 2020-04-05 | Discharge: 2020-04-05 | Disposition: A | Discharge status: Home / Self Care | Payer: Managed Care, Other (non HMO) | Source: Ambulatory Visit | Attending: Family Medicine | Admitting: Family Medicine

## 2020-04-05 ENCOUNTER — Ambulatory Visit (INDEPENDENT_AMBULATORY_CARE_PROVIDER_SITE_OTHER): Payer: Managed Care, Other (non HMO) | Admitting: Family Medicine

## 2020-04-05 ENCOUNTER — Other Ambulatory Visit: Payer: Self-pay

## 2020-04-05 ENCOUNTER — Encounter: Payer: Self-pay | Admitting: Family Medicine

## 2020-04-05 VITALS — BP 116/80 | HR 84 | Ht 66.0 in | Wt 178.5 lb

## 2020-04-05 DIAGNOSIS — Z114 Encounter for screening for human immunodeficiency virus [HIV]: Secondary | ICD-10-CM | POA: Diagnosis not present

## 2020-04-05 DIAGNOSIS — Z113 Encounter for screening for infections with a predominantly sexual mode of transmission: Secondary | ICD-10-CM | POA: Diagnosis present

## 2020-04-05 DIAGNOSIS — M259 Joint disorder, unspecified: Secondary | ICD-10-CM | POA: Insufficient documentation

## 2020-04-05 DIAGNOSIS — Z124 Encounter for screening for malignant neoplasm of cervix: Secondary | ICD-10-CM | POA: Insufficient documentation

## 2020-04-05 DIAGNOSIS — M2540 Effusion, unspecified joint: Secondary | ICD-10-CM | POA: Insufficient documentation

## 2020-04-05 DIAGNOSIS — Z1159 Encounter for screening for other viral diseases: Secondary | ICD-10-CM

## 2020-04-05 MED ORDER — MELOXICAM 15 MG PO TABS: 15.0000 mg | ORAL_TABLET | Freq: Every day | ORAL | 0 refills | Status: DC

## 2020-04-05 NOTE — Assessment & Plan Note (Signed)
Pap smear performed today.  Patient has had prior abnormal Pap smears and has had LEEP and cryotherapy per her report.  Last Pap smear in 2016 was negative with negative HPV.  We will follow up results.  Gonorrhea/chlamydia also obtained today.  Also obtain HIV/RPR for screening for sexually transmitted diseases.

## 2020-04-05 NOTE — Progress Notes (Signed)
    SUBJECTIVE:   CHIEF COMPLAINT / HPI:   Pap Smear Last Pap smear Last Pap 09/2014, negative, negative HPV.  Prior abnormal Pap smear, previously and has had freezing and scraping Patient's last menstrual period was 03/30/2020. Postmenopausal bleeding: N/A Contraception: BTL 2014  Sexually Active: yes  Desire for STD Screening: yes, declines oral/anal, desires HIV/RPR Last mammogram: n/a Self breast exams: no  Concerns: none  Multiple Joint Complaints Has previously been seen at [facility] when she had carpal tunnel and no insurance  She is now having pain in her left wrist (non-dominant), back pain, right shoulder pain, bilateral hip pain Sometimes has swelling of her wrists Has had MRI previously at [facility] Would like a referral back to them  PERTINENT  PMH / PSH: History of carpal tunnel syndrome, generalized anxiety disorder, hyperlipidemia, bipolar 2 disorder  OBJECTIVE:   BP 116/80   Pulse 84   Ht 5\' 6"  (1.676 m)   Wt 178 lb 8 oz (81 kg)   LMP 03/30/2020   SpO2 98%   BMI 28.81 kg/m    Physical Exam: General: In NAD Respiratory: Breathing comfortably on room air GU: Pelvic exam performed with patient supine.  Chaperone in room.  Bilateral labia without abnormalities.  Cervix exhibits no discharge, no cervix abnormalities.  No vaginal lesions.  Vaginal discharge thin, clear.   ASSESSMENT/PLAN:   Screening for malignant neoplasm of cervix Pap smear performed today.  Patient has had prior abnormal Pap smears and has had LEEP and cryotherapy per her report.  Last Pap smear in 2016 was negative with negative HPV.  We will follow up results.  Gonorrhea/chlamydia also obtained today.  Also obtain HIV/RPR for screening for sexually transmitted diseases.  Multiple joint complaints Given patient has multiple joint complaints and has had some swelling, will go ahead and obtain a rheumatoid factor and anti-CCP.  Given that her symptoms are in multiple areas, would  hold off on imaging at this time, especially since patient would like to follow-up with [facility] and they will likely do there on imaging.  Referral placed to [facility].  Rx sent for meloxicam to use daily in the meantime.  Advised to take this with food and to avoid other NSAIDs while taking.  Should her rheumatoid work-up been nonrevealing, could consider mood as a factor.     Cleophas Dunker, [place]

## 2020-04-05 NOTE — Patient Instructions (Signed)
Thank you for coming to see me today. It was a pleasure. Today we talked about:   We will get some labs today.  If they are abnormal or we need to do something about them, I will call you.  If they are normal, I will send you a message on MyChart (if it is active) or a letter in the mail.  If you don't hear from [place] in 2 weeks, please call the office at the number below.  We performed a Pap and STD testing today. This will take a few days to come back. If your MyChart is activated, we will message you on there if everything is normal, otherwise we will call. If we need to treat something we will also call you. If you do not hear from [place] in the next 4 days, please give [place] a call.  I have placed a referral to [facility] for your pain.  If you do not hear from them in the next 2 weeks, please give [place] a call.  I have also sent in mobic to use daily.  Take with food.  Do not take with other NSAIDs (ibuprofen, aleve, etc.).   Please follow-up with me in the next few months.  If you have any questions or concerns, please do not hesitate to call the office at [phone].  Best,   Arizona Constable, DO

## 2020-04-05 NOTE — Assessment & Plan Note (Addendum)
Given patient has multiple joint complaints and has had some swelling, will go ahead and obtain a rheumatoid factor and anti-CCP.  Given that her symptoms are in multiple areas, would hold off on imaging at this time, especially since patient would like to follow-up with [facility] and they will likely do there on imaging.  Referral placed to [facility].  Rx sent for meloxicam to use daily in the meantime.  Advised to take this with food and to avoid other NSAIDs while taking.  Should her rheumatoid work-up been nonrevealing, could consider mood as a factor.

## 2020-04-06 LAB — HCV INTERPRETATION

## 2020-04-06 LAB — HIV ANTIBODY (ROUTINE TESTING W REFLEX): HIV Screen 4th Generation wRfx: NONREACTIVE

## 2020-04-06 LAB — HCV AB W REFLEX TO QUANT PCR: HCV Ab: 0.1 s/co ratio (ref 0.0–0.9)

## 2020-04-06 LAB — RHEUMATOID FACTOR: Rhuematoid fact SerPl-aCnc: <10 IU/mL (ref <14.0)

## 2020-04-06 LAB — RPR: RPR Ser Ql: NONREACTIVE

## 2020-04-07 LAB — CYTOLOGY - PAP
Chlamydia: NEGATIVE
Comment: NEGATIVE
Comment: NEGATIVE
Comment: NEGATIVE
Comment: NORMAL
High risk HPV: NEGATIVE
Neisseria Gonorrhea: NEGATIVE
Trichomonas: NEGATIVE

## 2020-04-09 LAB — ANTI-CCP AB, IGG + IGA (RDL): Anti-CCP Ab, IgG + IgA (RDL): <20 Units (ref <20)

## 2020-07-06 ENCOUNTER — Telehealth: Payer: Self-pay | Admitting: Family Medicine

## 2020-07-06 NOTE — Telephone Encounter (Signed)
Patient is calling stating she is needing bloodwork, that it was discussed with Dr. I don't see nay orders, please advise. Thanks!

## 2020-07-06 NOTE — Telephone Encounter (Signed)
We got bloodwork at her last visit which was normal and nothing further had bee discussed. Unclear what she is referring to, likely needs an appointment.

## 2020-07-13 NOTE — Telephone Encounter (Signed)
Spoke to pt. She is wanting her "yearly labs". Wants thyroid and cholesterol checked. Scheduled an appt on 6/3 with PCP. Ottis Stain, CMA

## 2020-07-28 NOTE — Progress Notes (Signed)
SUBJECTIVE:   CHIEF COMPLAINT / HPI:   Health maintenance , Obesity She has had previously elevated cholesterol levels that were over a year ago and would like her lipid panel repeated She would also like to have a BMP obtained for screening purposes Has not been very active with changes at her job  Menopause concern Still having periods, but they are irregular, sometimes light, sometimes heavy  She would like to know what some symptoms of menopause may be and would like to know if there is a blood test that she needs to have for this to know if she needs hormone replacement therapy  Bilateral Hip Pain, arms, joint pain, shoulders Has had a negative RF Seen by Ortho who stated that she had some tendinitis, but no reason for hip pain She feels that the hip pain is the worst Has been taking ibuprofen 4 tablets every 8 hrs She was told that she has significant vitamin D deficiency and this may be contributing to her pain, but has not had improvement with taking vitamin D  Vit D Deficiency Checked at ortho, was told it was really low Took vitamin D for 6 weeks and completed it  Mucus Production States that sometimes after eating certain foods, especially after drinking soda, she notices that she has increased mucus production in her throat She states that she is very bothered by this She reports that she has had burning in her esophagus off and on with certain foods since having her children She restates that she has not taken anything for this previously and would like to start something  PERTINENT  PMH / PSH: Generalized anxiety disorder, bipolar 2 disorder  OBJECTIVE:   BP 118/80   Pulse 93   Ht 5\' 6"  (1.676 m)   Wt 186 lb 12.8 oz (84.7 kg)   LMP 07/13/2020   SpO2 98%   BMI 30.15 kg/m    Physical Exam:  General: 45 y.o. female in NAD Cardio: RRR no m/r/g Lungs: CTAB, no wheezing, no rhonchi, no crackles, no IWOB on RA Skin: warm and dry Extremities: No  edema   ASSESSMENT/PLAN:   Obesity (BMI 30-39.9) Encourage healthy diet and exercise.  Lipid panel and BMP obtained.  Irregular menses Discussed with patient that irregular periods could be a sign of starting to run to menopause.  She denies any other symptoms of menopause including hot flashes, night sweats, vaginal dryness specifically.  Discussed with patient that there would be no need to perform lab work as this is not routinely recommended and that symptoms alone can guide management.  Also discussed that hormonal replacement therapy is not based off of lab values, but mostly at patient's desire for treatment based on symptoms that they cannot handle.  She was reassured by this.  Multiple joint complaints Discussed with patient that given her symptoms do not seem consistent with rheumatoid arthritis, her tendinitis is being treated, and she continues to have hip pain but has no orthopedic cause that is known, could consider a psychiatric component.  She was understanding of this.  With her history of bipolar disorder, she would most safely be managed by a psychiatrist.  She agreed with this and will try a psychiatrist.  She was given phone numbers for psychiatrist in the area.  Hyperlipidemia Lipid panel obtained today.  Vitamin D deficiency Patient reports she is taken at least 6 weeks of 50,000 units of vitamin D.  Will obtain a vitamin D level to assess at  her request.  Did recommend that she may require daily vitamin D supplementation that is over-the-counter, but she would like to wait until vitamin D level returns.  Gastroesophageal reflux disease Advised that her mucus production could be related to uncontrolled reflux.  We will start her on famotidine 20 mg twice daily for 1 month, then can decrease to once daily if she is able to the next month.  If she does not have improvement, would consider H. pylori testing with trial of PPI or referral to GI.     Cleophas Dunker,  [place]

## 2020-07-30 ENCOUNTER — Other Ambulatory Visit: Payer: Self-pay

## 2020-07-30 ENCOUNTER — Encounter: Payer: Self-pay | Admitting: Family Medicine

## 2020-07-30 ENCOUNTER — Ambulatory Visit (INDEPENDENT_AMBULATORY_CARE_PROVIDER_SITE_OTHER): Payer: Managed Care, Other (non HMO) | Admitting: Family Medicine

## 2020-07-30 VITALS — BP 118/80 | HR 93 | Ht 66.0 in | Wt 186.8 lb

## 2020-07-30 DIAGNOSIS — E559 Vitamin D deficiency, unspecified: Secondary | ICD-10-CM | POA: Diagnosis not present

## 2020-07-30 DIAGNOSIS — E785 Hyperlipidemia, unspecified: Secondary | ICD-10-CM

## 2020-07-30 DIAGNOSIS — E669 Obesity, unspecified: Secondary | ICD-10-CM | POA: Diagnosis not present

## 2020-07-30 DIAGNOSIS — N926 Irregular menstruation, unspecified: Secondary | ICD-10-CM

## 2020-07-30 DIAGNOSIS — K219 Gastro-esophageal reflux disease without esophagitis: Secondary | ICD-10-CM

## 2020-07-30 DIAGNOSIS — M259 Joint disorder, unspecified: Secondary | ICD-10-CM

## 2020-07-30 MED ORDER — FAMOTIDINE 20 MG PO TABS: 20.0000 mg | ORAL_TABLET | Freq: Two times a day (BID) | ORAL | 1 refills | Status: DC

## 2020-07-30 NOTE — Assessment & Plan Note (Signed)
Lipid panel obtained today.  

## 2020-07-30 NOTE — Assessment & Plan Note (Signed)
Encourage healthy diet and exercise.  Lipid panel and BMP obtained.

## 2020-07-30 NOTE — Assessment & Plan Note (Signed)
Patient reports she is taken at least 6 weeks of 50,000 units of vitamin D.  Will obtain a vitamin D level to assess at her request.  Did recommend that she may require daily vitamin D supplementation that is over-the-counter, but she would like to wait until vitamin D level returns.

## 2020-07-30 NOTE — Assessment & Plan Note (Signed)
Discussed with patient that irregular periods could be a sign of starting to run to menopause.  She denies any other symptoms of menopause including hot flashes, night sweats, vaginal dryness specifically.  Discussed with patient that there would be no need to perform lab work as this is not routinely recommended and that symptoms alone can guide management.  Also discussed that hormonal replacement therapy is not based off of lab values, but mostly at patient's desire for treatment based on symptoms that they cannot handle.  She was reassured by this.

## 2020-07-30 NOTE — Patient Instructions (Addendum)
Thank you for coming to see me today. It was a pleasure. Today we talked about:   Call a psychiatrist below.  We will get some labs today.  If they are abnormal or we need to do something about them, I will call you.  If they are normal, I will send you a message on MyChart (if it is active) or a letter in the mail.  If you don't hear from [place] in 2 weeks, please call the office at the number below.  We will start you at pepcid 20mg  twice a day for a month, then you can take it daily if it is helping.  Please follow-up with new PCP in 2 months.  If you have any questions or concerns, please do not hesitate to call the office at [phone].  Best,   Arizona Constable, DO    Psychiatry Resource List (Adults and Children) Most of these providers will take Medicaid. please consult your insurance for a complete and updated list of available providers. When calling to make an appointment have your insurance information available to confirm you are covered.   [facility]:Psychiatry and Counseling [address]. [place], [place] [address] [phone]  [facility]  [place], [place]:   [facility]: [address]     [phone]   Linna Hoff: [place]. [place],        [phone] : [place],    [place]: [phone] [address],                   [phone] Children: [place] [address]         [phone]  [place] (virtual only) [phone]    [facility]  (Psychiatry only; Adults /children 12 and over, will take Medicaid)  [place], [place], [place] [address]       [phone]   [facility] (Psychiatry & counseling ; adults & children ; will take Medicaid [address]  [address]   [place] [address]  Go on-line to complete referral ( [email or web address] [phone]    (Spanish speaking therapists)  [facility]  Psychiatry & counseling; Adults and children;  Call Registration prior to scheduling an appointment [phone]    [place], [place] [address]    [phone]  [facility] (Psychiatry & counseling; adults & children; Medicare no Medicaid)  [place], [place]  [address]      [phone]    [facility] (up to age 40)  Psychiatry & counseling ,will take Medicaid, must do counseling to receive psychiatry services  [address]. [place] [address]        ([facility] (Psychiatry & counseling; adults & children; will take Medicaid) Will need a referral from provider [address],  [place], [place]  [phone]   [facility] --- Walk-In Mon-Friday 8am-3pm ( will take Medicaid, Psychiatry, Adults & children,  [address], [place], [place]   [phone]   [facility]--, Walk-in M-F 8am-12pm and 1pm -3pm   (Counseling, Psychiatry, will take Medicaid, adults & children)  [address] [place], [place]  [phone]

## 2020-07-30 NOTE — Assessment & Plan Note (Signed)
Discussed with patient that given her symptoms do not seem consistent with rheumatoid arthritis, her tendinitis is being treated, and she continues to have hip pain but has no orthopedic cause that is known, could consider a psychiatric component.  She was understanding of this.  With her history of bipolar disorder, she would most safely be managed by a psychiatrist.  She agreed with this and will try a psychiatrist.  She was given phone numbers for psychiatrist in the area.

## 2020-07-30 NOTE — Assessment & Plan Note (Signed)
Advised that her mucus production could be related to uncontrolled reflux.  We will start her on famotidine 20 mg twice daily for 1 month, then can decrease to once daily if she is able to the next month.  If she does not have improvement, would consider H. pylori testing with trial of PPI or referral to GI.

## 2020-07-31 LAB — BASIC METABOLIC PANEL
BUN/Creatinine Ratio: 16 (ref 9–23)
BUN: 11 mg/dL (ref 6–24)
CO2: 22 mmol/L (ref 20–29)
Calcium: 8.8 mg/dL (ref 8.7–10.2)
Chloride: 103 mmol/L (ref 96–106)
Creatinine, Ser: 0.69 mg/dL (ref 0.57–1.00)
Glucose: 83 mg/dL (ref 65–99)
Potassium: 3.9 mmol/L (ref 3.5–5.2)
Sodium: 139 mmol/L (ref 134–144)
eGFR: 110 mL/min/{1.73_m2} (ref >59)

## 2020-07-31 LAB — LIPID PANEL
Chol/HDL Ratio: 5.9 ratio — ABNORMAL HIGH (ref 0.0–4.4)
Cholesterol, Total: 311 mg/dL — ABNORMAL HIGH (ref 100–199)
HDL: 53 mg/dL (ref >39)
LDL Chol Calc (NIH): 201 mg/dL — ABNORMAL HIGH (ref 0–99)
Triglycerides: 286 mg/dL — ABNORMAL HIGH (ref 0–149)
VLDL Cholesterol Cal: 57 mg/dL — ABNORMAL HIGH (ref 5–40)

## 2020-07-31 LAB — VITAMIN D 25 HYDROXY (VIT D DEFICIENCY, FRACTURES): Vit D, 25-Hydroxy: 26.3 ng/mL — ABNORMAL LOW (ref 30.0–100.0)

## 2020-08-02 ENCOUNTER — Encounter: Payer: Self-pay | Admitting: Family Medicine

## 2020-08-02 ENCOUNTER — Other Ambulatory Visit: Payer: Self-pay | Admitting: Family Medicine

## 2020-08-02 DIAGNOSIS — E785 Hyperlipidemia, unspecified: Secondary | ICD-10-CM

## 2020-08-02 MED ORDER — ATORVASTATIN CALCIUM 40 MG PO TABS: 40.0000 mg | ORAL_TABLET | Freq: Every day | ORAL | 3 refills | Status: DC

## 2020-08-04 ENCOUNTER — Other Ambulatory Visit: Payer: Self-pay

## 2020-08-04 ENCOUNTER — Ambulatory Visit (HOSPITAL_COMMUNITY)
Admission: EM | Admit: 2020-08-04 | Discharge: 2020-08-04 | Disposition: A | Discharge status: Home / Self Care | Payer: Managed Care, Other (non HMO) | Attending: Family Medicine | Admitting: Family Medicine

## 2020-08-04 ENCOUNTER — Emergency Department (HOSPITAL_COMMUNITY): Payer: Managed Care, Other (non HMO)

## 2020-08-04 ENCOUNTER — Ambulatory Visit (INDEPENDENT_AMBULATORY_CARE_PROVIDER_SITE_OTHER): Payer: Managed Care, Other (non HMO)

## 2020-08-04 ENCOUNTER — Encounter (HOSPITAL_COMMUNITY): Payer: Self-pay

## 2020-08-04 ENCOUNTER — Emergency Department (HOSPITAL_COMMUNITY)
Admission: EM | Admit: 2020-08-04 | Discharge: 2020-08-04 | Disposition: A | Discharge status: Home / Self Care | Payer: Managed Care, Other (non HMO) | Attending: Emergency Medicine | Admitting: Emergency Medicine

## 2020-08-04 ENCOUNTER — Encounter (HOSPITAL_COMMUNITY): Payer: Self-pay | Admitting: Emergency Medicine

## 2020-08-04 DIAGNOSIS — E785 Hyperlipidemia, unspecified: Secondary | ICD-10-CM

## 2020-08-04 DIAGNOSIS — R079 Chest pain, unspecified: Secondary | ICD-10-CM

## 2020-08-04 DIAGNOSIS — Z87891 Personal history of nicotine dependence: Secondary | ICD-10-CM | POA: Diagnosis not present

## 2020-08-04 DIAGNOSIS — R0789 Other chest pain: Secondary | ICD-10-CM | POA: Insufficient documentation

## 2020-08-04 DIAGNOSIS — R110 Nausea: Secondary | ICD-10-CM | POA: Diagnosis not present

## 2020-08-04 LAB — CBC WITH DIFFERENTIAL/PLATELET
Abs Immature Granulocytes: 0.02 10*3/uL (ref 0.00–0.07)
Basophils Absolute: 0.1 10*3/uL (ref 0.0–0.1)
Basophils Relative: 1 %
Eosinophils Absolute: 0.2 10*3/uL (ref 0.0–0.5)
Eosinophils Relative: 3 %
HCT: 44.3 % (ref 36.0–46.0)
Hemoglobin: 14.6 g/dL (ref 12.0–15.0)
Immature Granulocytes: 0 %
Lymphocytes Relative: 26 %
Lymphs Abs: 2.4 10*3/uL (ref 0.7–4.0)
MCH: 28.9 pg (ref 26.0–34.0)
MCHC: 33 g/dL (ref 30.0–36.0)
MCV: 87.5 fL (ref 80.0–100.0)
Monocytes Absolute: 0.7 10*3/uL (ref 0.1–1.0)
Monocytes Relative: 7 %
Neutro Abs: 5.9 10*3/uL (ref 1.7–7.7)
Neutrophils Relative %: 63 %
Platelets: 350 10*3/uL (ref 150–400)
RBC: 5.06 MIL/uL (ref 3.87–5.11)
RDW: 12.6 % (ref 11.5–15.5)
WBC: 9.3 10*3/uL (ref 4.0–10.5)
nRBC: 0 % (ref 0.0–0.2)

## 2020-08-04 LAB — I-STAT CHEM 8, ED
BUN: 12 mg/dL (ref 6–20)
Calcium, Ion: 1.21 mmol/L (ref 1.15–1.40)
Chloride: 104 mmol/L (ref 98–111)
Creatinine, Ser: 0.7 mg/dL (ref 0.44–1.00)
Glucose, Bld: 91 mg/dL (ref 70–99)
HCT: 44 % (ref 36.0–46.0)
Hemoglobin: 15 g/dL (ref 12.0–15.0)
Potassium: 4.4 mmol/L (ref 3.5–5.1)
Sodium: 140 mmol/L (ref 135–145)
TCO2: 27 mmol/L (ref 22–32)

## 2020-08-04 LAB — TROPONIN I (HIGH SENSITIVITY)
Troponin I (High Sensitivity): 3 ng/L (ref <18)
Troponin I (High Sensitivity): <2 ng/L (ref <18)

## 2020-08-04 LAB — I-STAT BETA HCG BLOOD, ED (MC, WL, AP ONLY): I-stat hCG, quantitative: <5 m[IU]/mL (ref <5)

## 2020-08-04 LAB — BASIC METABOLIC PANEL
Anion gap: 9 (ref 5–15)
BUN: 10 mg/dL (ref 6–20)
CO2: 27 mmol/L (ref 22–32)
Calcium: 9.3 mg/dL (ref 8.9–10.3)
Chloride: 103 mmol/L (ref 98–111)
Creatinine, Ser: 0.74 mg/dL (ref 0.44–1.00)
GFR, Estimated: >60 mL/min (ref >60)
Glucose, Bld: 85 mg/dL (ref 70–99)
Potassium: 4.3 mmol/L (ref 3.5–5.1)
Sodium: 139 mmol/L (ref 135–145)

## 2020-08-04 MED ORDER — LIDOCAINE VISCOUS HCL 2 % MT SOLN
15.0000 mL | Freq: Once | OROMUCOSAL | Status: AC
  Administered 2020-08-04: 15 mL via ORAL

## 2020-08-04 MED ORDER — ALUM & MAG HYDROXIDE-SIMETH 200-200-20 MG/5ML PO SUSP
ORAL | Status: AC
  Filled 2020-08-04: qty 30

## 2020-08-04 MED ORDER — ALUM & MAG HYDROXIDE-SIMETH 200-200-20 MG/5ML PO SUSP
30.0000 mL | Freq: Once | ORAL | Status: AC
  Administered 2020-08-04: 30 mL via ORAL

## 2020-08-04 MED ORDER — KETOROLAC TROMETHAMINE 15 MG/ML IJ SOLN: 15.0000 mg | Freq: Once | INTRAMUSCULAR | Status: DC

## 2020-08-04 MED ORDER — IOHEXOL 350 MG/ML SOLN
100.0000 mL | Freq: Once | INTRAVENOUS | Status: AC | PRN
  Administered 2020-08-04: 100 mL via INTRAVENOUS

## 2020-08-04 MED ORDER — LIDOCAINE VISCOUS HCL 2 % MT SOLN
OROMUCOSAL | Status: AC
  Filled 2020-08-04: qty 15

## 2020-08-04 NOTE — ED Provider Notes (Signed)
Emergency Medicine Provider Triage Evaluation Note  [name] , a 45 y.o. female  was evaluated in triage.  Pt complains of chest pressure/pain that started this AM. She was seen at [facility] pta and sent here for further eval. Denies sob.   Denies leg pain/swelling, hemoptysis, recent surgery, recent long travel, hormone use, personal hx of cancer, or hx of DVT/PE.   States that both of her sisters and father have hx of PE. One of her sister has protein S deficiency.   Review of Systems  Positive: Chest pain Negative: ble swelling  Physical Exam  BP 137/89 (BP Location: Left Arm)   Pulse 98   Temp 98.8 F (37.1 C) (Oral)   Resp 16   LMP 07/13/2020   SpO2 99%  Gen:   Awake, no distress   Resp:  Normal effort  MSK:   Moves extremities without difficulty  Other:  Tachycardia, heart with regular rhythm, lungs ctab  Medical Decision Making  Medically screening exam initiated at 3:45 PM.  Appropriate orders placed.  [name] was informed that the remainder of the evaluation will be completed by another provider, this initial triage assessment does not replace that evaluation, and the importance of remaining in the ED until their evaluation is complete.     Rodney Booze, PA-C 08/04/20 1545    Lajean Saver, MD 08/06/20 1610

## 2020-08-04 NOTE — ED Provider Notes (Signed)
[facility] EMERGENCY DEPARTMENT Provider Note   CSN: [id number] Arrival date & time: 08/04/20  1407     History No chief complaint on file.   [name] is a 45 y.o. female.  HPI 45 year old female presents with chest pain.  It feels like a pressure in her chest and somewhat of a dull sensation.  Started around 7:30 AM and has been constant ever since.  She went to urgent care and received a GI cocktail that did not help.  The pain is about a 6 out of 10 when she is sitting up but if she lays flat it worsens to about a 9.  Standing up also makes it a little bit worse.  When she starts walking there is no change.  There is no dyspnea, radiation of the pain or vomiting though she felt a little nauseated.  She has not taken any other meds at home.  She has a family history significant for multiple family members with PE.  She states she has been told she has high cholesterol but no hypertension.  No known history of diabetes.  Past Medical History:  Diagnosis Date  . Depression with anxiety   . Post partum depression 07/20/2011  . Postpartum hemorrhage    2nd pregnancy  . UTI (lower urinary tract infection)    history of     Patient Active Problem List   Diagnosis Date Noted  . Obesity (BMI 30-39.9) 07/30/2020  . Gastroesophageal reflux disease 07/30/2020  . Vitamin D deficiency 07/30/2020  . Irregular menses 07/30/2020  . Screening for malignant neoplasm of cervix 04/05/2020  . Multiple joint complaints 04/05/2020  . Family history of thrombosis 07/11/2019  . Right elbow pain 07/11/2019  . Does not have health insurance 07/11/2019  . Carpal tunnel syndrome 09/28/2017  . Lateral epicondylitis of left elbow 09/28/2017  . Bipolar 2 disorder ([place]) 08/28/2017  . Mood disorder ([place]) 02/04/2015  . Skin tag 06/25/2012  . Mole of skin 06/25/2012  . Hyperlipidemia 05/22/2012  . Overweight (BMI 25.0-29.9) 04/29/2012  . Generalized anxiety disorder 03/28/2010     Past Surgical History:  Procedure Laterality Date  . LAPAROSCOPIC BILATERAL SALPINGECTOMY  01/19/2015   Procedure: LAPAROSCOPIC BILATERAL SALPINGECTOMY with Sharmon Leyden Clips;  Surgeon: Woodroe Mode, MD;  Location: [facility];  Service: Gynecology;;  . LAPAROSCOPIC LYSIS OF ADHESIONS  01/19/2015   Procedure: LAPAROSCOPIC LYSIS OF ADHESIONS;  Surgeon: Woodroe Mode, MD;  Location: [facility];  Service: Gynecology;;  . LAPAROSCOPY Bilateral 01/19/2015   Procedure: DIAGNOSTIC LAPAROSCOPY;  Surgeon: Woodroe Mode, MD;  Location: [facility];  Service: Gynecology;  Laterality: Bilateral;  . TUBAL LIGATION  02/27/2012   Procedure: POST PARTUM TUBAL LIGATION;  Surgeon: Woodroe Mode, MD;  Location: [facility];  Service: Gynecology;  Laterality: Bilateral;     OB History    Gravida  9   Para  5   Term  5   Preterm  0   AB  4   Living  5     SAB      IAB  4   Ectopic      Multiple      Live Births  5           Family History  Problem Relation Age of Onset  . Drug abuse Maternal Grandmother   . Cancer Maternal Grandmother        throat, brain  . Diabetes Father   . Cancer Father  prostate  . Stroke Maternal Grandfather   . Stroke Maternal Aunt   . CAD Maternal Uncle   . Diabetes Maternal Aunt   . Cancer Other        stomach, pancreas (maternal)    Social History   Tobacco Use  . Smoking status: Former Smoker    Quit date: 04/30/2006    Years since quitting: 14.2  . Smokeless tobacco: Never Used  Substance Use Topics  . Alcohol use: No    Alcohol/week: 0.0 standard drinks    Comment: h/o alcohol abuse  . Drug use: No    Comment: h/o MJ use    Home Medications Prior to Admission medications   Medication Sig Start Date End Date Taking? Authorizing Provider  atorvastatin (LIPITOR) 40 MG tablet Take 1 tablet (40 mg total) by mouth daily. 08/02/20   Meccariello, Bernita Raisin, DO  famotidine (PEPCID) 20 MG tablet Take 1 tablet (20 mg total) by mouth 2 (two) times daily.  07/30/20   Meccariello, Bernita Raisin, DO    Allergies    Patient has no known allergies.  Review of Systems   Review of Systems  Constitutional: Negative for fever.  Respiratory: Negative for cough and shortness of breath.   Cardiovascular: Positive for chest pain.  Gastrointestinal: Positive for nausea. Negative for abdominal pain and vomiting.  Musculoskeletal: Negative for back pain.  All other systems reviewed and are negative.   Physical Exam Updated Vital Signs BP 114/85   Pulse 83   Temp 98.2 F (36.8 C) (Oral)   Resp (!) 23   LMP 07/13/2020   SpO2 100%   Physical Exam Vitals and nursing note reviewed.  Constitutional:      General: She is not in acute distress.    Appearance: She is well-developed. She is not ill-appearing or diaphoretic.  HENT:     Head: Normocephalic and atraumatic.     Right Ear: External ear normal.     Left Ear: External ear normal.     Nose: Nose normal.  Eyes:     General:        Right eye: No discharge.        Left eye: No discharge.  Cardiovascular:     Rate and Rhythm: Normal rate and regular rhythm.     Heart sounds: Normal heart sounds.  Pulmonary:     Effort: Pulmonary effort is normal.     Breath sounds: Normal breath sounds.  Chest:     Chest wall: Tenderness (mild reproduction of the pressure with palpation) present.  Abdominal:     Palpations: Abdomen is soft.     Tenderness: There is no abdominal tenderness.  Musculoskeletal:     Right lower leg: No edema.     Left lower leg: No edema.  Skin:    General: Skin is warm and dry.  Neurological:     Mental Status: She is alert.  Psychiatric:        Mood and Affect: Mood is not anxious.     ED Results / Procedures / Treatments   Labs (all labs ordered are listed, but only abnormal results are displayed) Labs Reviewed  BASIC METABOLIC PANEL  CBC WITH DIFFERENTIAL/PLATELET  I-STAT CHEM 8, ED  I-STAT BETA HCG BLOOD, ED ([facility], [facility], [facility] ONLY)  TROPONIN I (HIGH SENSITIVITY)   TROPONIN I (HIGH SENSITIVITY)    EKG EKG Interpretation  Date/Time:  Wednesday August 04 2020 15:16:22 EDT Ventricular Rate:  75 PR Interval:  142 QRS Duration:  84 QT Interval:  374 QTC Calculation: 417 R Axis:   47 Text Interpretation: Normal sinus rhythm Normal ECG tachycardia from earlier in the day has resolved Confirmed by Sherwood Gambler [phone]) on 08/04/2020 4:06:56 PM   Radiology DG Chest 2 View  Result Date: 08/04/2020 CLINICAL DATA:  Chest pain EXAM: CHEST - 2 VIEW COMPARISON:  11/30/2010 FINDINGS: The heart size and mediastinal contours are within normal limits. Both lungs are clear. The visualized skeletal structures are unremarkable. IMPRESSION: No active cardiopulmonary disease. Electronically Signed   By: Kerby Moors M.D.   On: 08/04/2020 13:52   CT Angio Chest PE W and/or Wo Contrast  Result Date: 08/04/2020 CLINICAL DATA:  PE suspected, high prob EXAM: CT ANGIOGRAPHY CHEST WITH CONTRAST TECHNIQUE: Multidetector CT imaging of the chest was performed using the standard protocol during bolus administration of intravenous contrast. Multiplanar CT image reconstructions and MIPs were obtained to evaluate the vascular anatomy. CONTRAST:  159mL OMNIPAQUE IOHEXOL 350 MG/ML SOLN COMPARISON:  Same-day radiograph FINDINGS: Cardiovascular: Satisfactory opacification of the pulmonary arteries to the segmental level. No evidence of pulmonary embolism. Normal heart size. No pericardial effusion. Mediastinum/Nodes: No enlarged mediastinal, hilar, or axillary lymph nodes. Thyroid gland, trachea, and esophagus demonstrate no significant findings. Lungs/Pleura: Lungs are clear. No pleural effusion or pneumothorax. There is a 4 mm pulmonary nodule in the left lung base (axial image 80), which is unchanged since 06/14/2014 CT and likely benign. Upper Abdomen: 4 mm nonobstructive right renal calculus. Musculoskeletal: No acute osseous abnormality. There is a sclerotic lesion within the T7 vertebral  body which is most likely a benign bone island. Review of the MIP images confirms the above findings. IMPRESSION: No pulmonary embolism or other acute findings in the chest. 4 mm nonobstructive right renal calculus. Electronically Signed   By: Maurine Simmering   On: 08/04/2020 16:44    Procedures Ultrasound ED Echo  Date/Time: 08/04/2020 4:30 PM Performed by: Sherwood Gambler, MD Authorized by: Sherwood Gambler, MD   Procedure details:    Indications: chest pain     Views: subxiphoid and parasternal long axis view     Images: archived     Limitations:  Acoustic shadowing Findings:    Pericardium comment:  No to minimal effusion   LV Function: normal (>50% EF)   Impression:    Impression: normal       Medications Ordered in ED Medications  iohexol (OMNIPAQUE) 350 MG/ML injection 100 mL (100 mLs Intravenous Contrast Given 08/04/20 1610)    ED Course  I have reviewed the triage vital signs and the nursing notes.  Pertinent labs & imaging results that were available during my care of the patient were reviewed by me and considered in my medical decision making (see chart for details).    MDM Rules/Calculators/A&P                          CT, labs, and ECGs have been reviewed.  Initial ECG with some nonspecific T waves that was probably rate related.  Overall, no clear cause of her chest discomfort.  It is not exertional.  CT angiography that was ordered in triage is negative.  No obvious pericardial effusion or certainly no significant 1.  Given positional changes this could be musculoskeletal and I recommended she start NSAIDs.  Unfortunately there was a delay in getting her some Toradol or other meds because of other sick patients that the nurse was tied up with.  Patient has  declined meds now and will take ibuprofen when she gets home.  Discussed return precautions. Final Clinical Impression(s) / ED Diagnoses Final diagnoses:  Nonspecific chest pain    Rx / DC Orders ED Discharge  Orders    None       Sherwood Gambler, MD 08/04/20 2007

## 2020-08-04 NOTE — ED Triage Notes (Signed)
Patient complains of chest pain since 0730 this am. States that it starts at top of esophagus to epigastric area. Patient sent from UC for same. Had GI cocktail at [facility] with no relief. Patient nonsmoker. Patient with no associated symptoms

## 2020-08-04 NOTE — ED Triage Notes (Signed)
Pt is present today with chest pain.Pt states that the pain starts at the top her sternum and radiates down to the center of her chest. Pt states that her sx started morning. Pt denies any SOB

## 2020-08-04 NOTE — ED Notes (Signed)
Patient is being discharged from the Urgent Care and sent to the Emergency Department via pov . Per hagler, patient is in need of higher level of care due to continued chest pain. Patient is aware and verbalizes understanding of plan of care.  Vitals:   08/04/20 1245  BP: 124/80  Pulse: (!) 109  Resp: 18  SpO2: 100%

## 2020-08-04 NOTE — Discharge Instructions (Signed)
If you develop recurrent, continued, or worsening chest pain, shortness of breath, fever, vomiting, abdominal or back pain, or any other new/concerning symptoms then return to the ER for evaluation.   Take ibuprofen up to 3 times a day to see if this helps with your pain.  You may also take Tylenol.

## 2020-08-04 NOTE — ED Provider Notes (Signed)
[place]   [id number] 08/04/20 Arrival Time: 1226  ASSESSMENT & PLAN:  1. Chest pain, unspecified type   2. Hyperlipidemia, unspecified hyperlipidemia type    To ED. Declines IV, NTG, EMS transport. Prefers private vehicle. No change in CP after GI cocktail.  I have personally viewed the imaging studies ordered this visit. Normal chest. No pneumothorax.  ECG: Performed today and interpreted by me: sinus tachycardia around 110 BPM; no STEMI.  Meds ordered this encounter  Medications  . AND Linked Order Group   . alum & mag hydroxide-simeth (MAALOX/MYLANTA) 200-200-20 MG/5ML suspension 30 mL   . lidocaine (XYLOCAINE) 2 % viscous mouth solution 15 mL   Reviewed expectations re: course of current medical issues. Questions answered. Outlined signs and symptoms indicating need for more acute intervention. Patient verbalized understanding. After Visit Summary given.   SUBJECTIVE:  History from: patient. [name] is a 45 y.o. female who presents with complaint of persistent non-radiating anterior chest pain; abrupt onset 07:30 today. Describes as a pressure "or like a brick on my chest". No specific aggravating or alleviating factors reported. Questions slight worsening with exertion. With nausea; no emesis. No associated SOB or diaphoresis. No reported back or abdominal pain. Denies: irregular heart beat, lower extremity edema, near-syncope, orthopnea, palpitations, paroxysmal nocturnal dyspnea and syncope. No treatment PTA. History of similar: no. Illicit drug use: very distant past; THC. H/O GERD "but his is different". No OCP use. No recent prolonged immobilization.  Recently diagnosed with hypercholesterolemia; has Rx for treatment but has not started.  Social History   Tobacco Use  Smoking Status Former Smoker  . Quit date: 04/30/2006  . Years since quitting: 14.2  Smokeless Tobacco Never Used   Social History   Substance and Sexual Activity   Alcohol Use No  . Alcohol/week: 0.0 standard drinks   Comment: h/o alcohol abuse    OBJECTIVE:  Vitals:   08/04/20 1245  BP: 124/80  Pulse: (!) 109  Resp: 18  TempSrc: Oral  SpO2: 100%    General appearance: alert, oriented, no acute distress Eyes: PERRLA; EOMI; conjunctivae normal HENT: normocephalic; atraumatic Neck: supple with FROM CV: reg; slight tachycardia noted Lungs: without labored respirations; speaks full sentences without difficulty; CTAB Chest Wall: without tenderness to palpation Abdomen: soft, non-tender; no guarding or rebound tenderness Extremities: without edema; without calf swelling or tenderness; symmetrical without gross deformities Skin: warm and dry; without rash or lesions Neuro: normal gait Psychological: alert and cooperative; normal mood and affect  Labs Reviewed: Results for orders placed or performed in visit on 81/15/72  Basic Metabolic Panel  Result Value Ref Range   Glucose 83 65 - 99 mg/dL   BUN 11 6 - 24 mg/dL   Creatinine, Ser 0.69 0.57 - 1.00 mg/dL   eGFR 110 >59 mL/min/1.73   BUN/Creatinine Ratio 16 9 - 23   Sodium 139 134 - 144 mmol/L   Potassium 3.9 3.5 - 5.2 mmol/L   Chloride 103 96 - 106 mmol/L   CO2 22 20 - 29 mmol/L   Calcium 8.8 8.7 - 10.2 mg/dL  Lipid Panel  Result Value Ref Range   Cholesterol, Total 311 (H) 100 - 199 mg/dL   Triglycerides 286 (H) 0 - 149 mg/dL   HDL 53 >39 mg/dL   VLDL Cholesterol Cal 57 (H) 5 - 40 mg/dL   LDL Chol Calc (NIH) 201 (H) 0 - 99 mg/dL   Chol/HDL Ratio 5.9 (H) 0.0 - 4.4 ratio  VITAMIN D 25  Hydroxy (Vit-D Deficiency, Fractures)  Result Value Ref Range   Vit D, 25-Hydroxy 26.3 (L) 30.0 - 100.0 ng/mL    Imaging: DG Chest 2 View  Result Date: 08/04/2020 CLINICAL DATA:  Chest pain EXAM: CHEST - 2 VIEW COMPARISON:  11/30/2010 FINDINGS: The heart size and mediastinal contours are within normal limits. Both lungs are clear. The visualized skeletal structures are unremarkable. IMPRESSION: No  active cardiopulmonary disease. Electronically Signed   By: Kerby Moors M.D.   On: 08/04/2020 13:52     No Known Allergies  Past Medical History:  Diagnosis Date  . Depression with anxiety   . Post partum depression 07/20/2011  . Postpartum hemorrhage    2nd pregnancy  . UTI (lower urinary tract infection)    history of    Social History   Socioeconomic History  . Marital status: Single    Spouse name: Not on file  . Number of children: Not on file  . Years of education: Not on file  . Highest education level: Not on file  Occupational History  . Not on file  Tobacco Use  . Smoking status: Former Smoker    Quit date: 04/30/2006    Years since quitting: 14.2  . Smokeless tobacco: Never Used  Substance and Sexual Activity  . Alcohol use: No    Alcohol/week: 0.0 standard drinks    Comment: h/o alcohol abuse  . Drug use: No    Comment: h/o MJ use  . Sexual activity: Yes    Birth control/protection: Surgical    Comment: 2-3 weeks ago  Other Topics Concern  . Not on file  Social History Narrative   Caffeine: 3-4 cans soda   Lives with 4 children. 2 grown children.   Occupation: works at [employer]: No regular exercise   Social Determinants of Radio broadcast assistant Strain: Not on [employer] Insecurity: Not on file  Transportation Needs: Not on file  Physical Activity: Not on file  Stress: Not on file  Social Connections: Not on file  Intimate Partner Violence: Not on file   Family History  Problem Relation Age of Onset  . Drug abuse Maternal Grandmother   . Cancer Maternal Grandmother        throat, brain  . Diabetes Father   . Cancer Father        prostate  . Stroke Maternal Grandfather   . Stroke Maternal Aunt   . CAD Maternal Uncle   . Diabetes Maternal Aunt   . Cancer Other        stomach, pancreas (maternal)   Past Surgical History:  Procedure Laterality Date  . LAPAROSCOPIC BILATERAL SALPINGECTOMY   01/19/2015   Procedure: LAPAROSCOPIC BILATERAL SALPINGECTOMY with Sharmon Leyden Clips;  Surgeon: Woodroe Mode, MD;  Location: [facility];  Service: Gynecology;;  . LAPAROSCOPIC LYSIS OF ADHESIONS  01/19/2015   Procedure: LAPAROSCOPIC LYSIS OF ADHESIONS;  Surgeon: Woodroe Mode, MD;  Location: [facility];  Service: Gynecology;;  . LAPAROSCOPY Bilateral 01/19/2015   Procedure: DIAGNOSTIC LAPAROSCOPY;  Surgeon: Woodroe Mode, MD;  Location: [facility];  Service: Gynecology;  Laterality: Bilateral;  . TUBAL LIGATION  02/27/2012   Procedure: POST PARTUM TUBAL LIGATION;  Surgeon: Woodroe Mode, MD;  Location: [facility];  Service: Gynecology;  Laterality: Bilateral;     Vanessa Kick, MD 08/04/20 1409

## 2020-09-16 ENCOUNTER — Encounter: Payer: Self-pay | Admitting: Student

## 2020-10-25 ENCOUNTER — Ambulatory Visit: Payer: Managed Care, Other (non HMO) | Admitting: Physician Assistant

## 2020-11-02 ENCOUNTER — Encounter: Payer: Self-pay | Admitting: Physician Assistant

## 2020-11-02 ENCOUNTER — Ambulatory Visit (INDEPENDENT_AMBULATORY_CARE_PROVIDER_SITE_OTHER): Payer: Managed Care, Other (non HMO) | Admitting: Physician Assistant

## 2020-11-02 ENCOUNTER — Other Ambulatory Visit: Payer: Self-pay

## 2020-11-02 VITALS — BP 110/76 | HR 108 | Temp 97.6°F | Ht 65.5 in | Wt 183.0 lb

## 2020-11-02 DIAGNOSIS — K5900 Constipation, unspecified: Secondary | ICD-10-CM

## 2020-11-02 DIAGNOSIS — K219 Gastro-esophageal reflux disease without esophagitis: Secondary | ICD-10-CM | POA: Diagnosis not present

## 2020-11-02 DIAGNOSIS — F39 Unspecified mood [affective] disorder: Secondary | ICD-10-CM

## 2020-11-02 DIAGNOSIS — E785 Hyperlipidemia, unspecified: Secondary | ICD-10-CM | POA: Diagnosis not present

## 2020-11-02 DIAGNOSIS — F1911 Other psychoactive substance abuse, in remission: Secondary | ICD-10-CM | POA: Diagnosis not present

## 2020-11-02 LAB — CBC WITH DIFFERENTIAL/PLATELET
Basophils Absolute: 0.1 10*3/uL (ref 0.0–0.1)
Basophils Relative: 0.8 % (ref 0.0–3.0)
Eosinophils Absolute: 0.2 10*3/uL (ref 0.0–0.7)
Eosinophils Relative: 2.7 % (ref 0.0–5.0)
HCT: 40.8 % (ref 36.0–46.0)
Hemoglobin: 13.6 g/dL (ref 12.0–15.0)
Lymphocytes Relative: 30.4 % (ref 12.0–46.0)
Lymphs Abs: 2.3 10*3/uL (ref 0.7–4.0)
MCHC: 33.3 g/dL (ref 30.0–36.0)
MCV: 86.5 fl (ref 78.0–100.0)
Monocytes Absolute: 0.7 10*3/uL (ref 0.1–1.0)
Monocytes Relative: 9.6 % (ref 3.0–12.0)
Neutro Abs: 4.2 10*3/uL (ref 1.4–7.7)
Neutrophils Relative %: 56.5 % (ref 43.0–77.0)
Platelets: 329 10*3/uL (ref 150.0–400.0)
RBC: 4.71 Mil/uL (ref 3.87–5.11)
RDW: 13.4 % (ref 11.5–15.5)
WBC: 7.5 10*3/uL (ref 4.0–10.5)

## 2020-11-02 LAB — LDL CHOLESTEROL, DIRECT: Direct LDL: 168 mg/dL

## 2020-11-02 LAB — COMPREHENSIVE METABOLIC PANEL
ALT: 44 U/L — ABNORMAL HIGH (ref 0–35)
AST: 25 U/L (ref 0–37)
Albumin: 4.2 g/dL (ref 3.5–5.2)
Alkaline Phosphatase: 71 U/L (ref 39–117)
BUN: 12 mg/dL (ref 6–23)
CO2: 26 mEq/L (ref 19–32)
Calcium: 8.9 mg/dL (ref 8.4–10.5)
Chloride: 105 mEq/L (ref 96–112)
Creatinine, Ser: 0.71 mg/dL (ref 0.40–1.20)
GFR: 104.25 mL/min (ref >60.00)
Glucose, Bld: 76 mg/dL (ref 70–99)
Potassium: 3.7 mEq/L (ref 3.5–5.1)
Sodium: 139 mEq/L (ref 135–145)
Total Bilirubin: 0.4 mg/dL (ref 0.2–1.2)
Total Protein: 7.3 g/dL (ref 6.0–8.3)

## 2020-11-02 LAB — LIPID PANEL
Cholesterol: 278 mg/dL — ABNORMAL HIGH (ref 0–200)
HDL: 57.6 mg/dL (ref >39.00)
NonHDL: 220.4
Total CHOL/HDL Ratio: 5
Triglycerides: 379 mg/dL — ABNORMAL HIGH (ref 0.0–149.0)
VLDL: 75.8 mg/dL — ABNORMAL HIGH (ref 0.0–40.0)

## 2020-11-02 MED ORDER — PANTOPRAZOLE SODIUM 40 MG PO TBEC: 40.0000 mg | DELAYED_RELEASE_TABLET | Freq: Every day | ORAL | 1 refills | Status: DC

## 2020-11-02 NOTE — Patient Instructions (Signed)
It was great to see you!  Start colace (also called docusate sodium) 100 mg twice daily If not improving your stools, please add 1 capful of miralax daily  If still no improvement, increase miralax by 1 capful every 3 days with a goal of at least one formed, soft bowel movement daily  Stop pecid and start prescribed protonix (pantoprazole) for your heartburn  Send me a mychart message in about a month so I know how it is going for your constipation and reflux  I will be in touch regarding your cholesterol results and your plan for your cholesterol medication.  Take care,  Inda Coke PA-C

## 2020-11-02 NOTE — Progress Notes (Signed)
[name] is a 45 y.o. female is here to establish care and discuss issues.  I acted as a Education administrator for [employer], PA-C Anselmo Pickler, LPN   History of Present Illness:   Chief Complaint  Patient presents with   Establish Care   Gastroesophageal Reflux   Constipation    HPI  Pt is here to establish care.  History of drug and alcohol abuse Reports that at age 89 she started abusing alcohol, cigarettes, and marijunaa. Did cocaine for 4 months during 3rd pregnancy for the first trimester -- did not know she was pregnant during that trimester. She reports that she has been completely sober for 14 years.   Mood disorder Hx of possible bipolar disorder with mania, anxiety, depression and other mood disorders. Reports that she has had several years where she has been without episodes of mania. Has tried multiple medications in the past -- latuda, buspar, wellbutrin, klonopin, ativan, abilify, xanax, paxil, amitriptyline, ambien, celexa, zoloft. She reports that she has not had good experience with therapy in the past.  Denies SI/HI.   GERD Pt c/o feeling like her food is just sitting in her stomach, heavy feeling. Pt has been on Pepcid 20 mg BID and feels like it is not helping. She states that she often times feels like she is regurgitating her food and then has to swallow it back denies. Denies rectal bleeding, family hx of colon cancer. Has never seen GI and has never been prescribed medication.   HLD Has been prescribed lipitor 40 mg daily. Tolerating well except she does feel like it may be causing her constipation (see below).   Constipation Pt c/o constipation x 3 months since starting on Atorvastatin. She does not have any trouble prior to starting medication. Having pellets of stools and having to strain significantly. Denies rectal bleeding. She has not tried anything OTC for her symptoms. Tries to drink adequate amount of water throughout the day.   Health  Maintenance Due  Topic Date Due   COVID-19 Vaccine (3 - Booster for Moderna series) 01/09/2020    Past Medical History:  Diagnosis Date   Depression with anxiety    Hyperlipidemia    Post partum depression 07/20/2011   Postpartum hemorrhage    2nd pregnancy   UTI (lower urinary tract infection)    history of      Social History   Tobacco Use   Smoking status: Former    Types: Cigarettes    Quit date: 04/30/2006    Years since quitting: 14.5   Smokeless tobacco: Never  Vaping Use   Vaping Use: Never used  Substance Use Topics   Alcohol use: No    Alcohol/week: 0.0 standard drinks    Comment: h/o alcohol abuse   Drug use: No    Comment: h/o MJ use    Past Surgical History:  Procedure Laterality Date   LAPAROSCOPIC BILATERAL SALPINGECTOMY  01/19/2015   Procedure: LAPAROSCOPIC BILATERAL SALPINGECTOMY with Sharmon Leyden Clips;  Surgeon: Woodroe Mode, MD;  Location: [facility];  Service: Gynecology;;   LAPAROSCOPIC LYSIS OF ADHESIONS  01/19/2015   Procedure: LAPAROSCOPIC LYSIS OF ADHESIONS;  Surgeon: Woodroe Mode, MD;  Location: [facility];  Service: Gynecology;;   LAPAROSCOPY Bilateral 01/19/2015   Procedure: DIAGNOSTIC LAPAROSCOPY;  Surgeon: Woodroe Mode, MD;  Location: [facility];  Service: Gynecology;  Laterality: Bilateral;   TUBAL LIGATION  02/27/2012   Procedure: POST PARTUM TUBAL LIGATION;  Surgeon: Woodroe Mode, MD;  Location: [facility];  Service: Gynecology;  Laterality: Bilateral;    Family History  Problem Relation Age of Onset   Drug abuse Maternal Grandmother    Cancer Maternal Grandmother        throat, brain   Diabetes Father    Cancer Father        prostate   Stroke Maternal Grandfather    Stroke Maternal Aunt    CAD Maternal Uncle    Diabetes Maternal Aunt    Cancer Other        stomach, pancreas (maternal)    PMHx, SurgHx, SocialHx, FamHx, Medications, and Allergies were reviewed in the Visit Navigator and updated as appropriate.   Patient Active Problem  List   Diagnosis Date Noted   Obesity (BMI 30-39.9) 07/30/2020   Gastroesophageal reflux disease 07/30/2020   Vitamin D deficiency 07/30/2020   Irregular menses 07/30/2020   Multiple joint complaints 04/05/2020   Family history of thrombosis 07/11/2019   Right elbow pain 07/11/2019   Carpal tunnel syndrome 09/28/2017   Lateral epicondylitis of left elbow 09/28/2017   Bipolar 2 disorder ([place]) 08/28/2017   Mood disorder ([place]) 02/04/2015   Hyperlipidemia 05/22/2012   Overweight (BMI 25.0-29.9) 04/29/2012   Generalized anxiety disorder 03/28/2010    Social History   Tobacco Use   Smoking status: Former    Types: Cigarettes    Quit date: 04/30/2006    Years since quitting: 14.5   Smokeless tobacco: Never  Vaping Use   Vaping Use: Never used  Substance Use Topics   Alcohol use: No    Alcohol/week: 0.0 standard drinks    Comment: h/o alcohol abuse   Drug use: No    Comment: h/o MJ use    Current Medications and Allergies:    Current Outpatient Medications:    atorvastatin (LIPITOR) 40 MG tablet, Take 1 tablet (40 mg total) by mouth daily., Disp: 90 tablet, Rfl: 3   famotidine (PEPCID) 20 MG tablet, Take 1 tablet (20 mg total) by mouth 2 (two) times daily., Disp: 60 tablet, Rfl: 1  No Known Allergies  Review of Systems   ROS Negative unless otherwise specified per HPI.  Vitals:   Vitals:   11/02/20 1448  BP: 110/76  Pulse: (!) 108  Temp: 97.6 F (36.4 C)  TempSrc: Temporal  SpO2: 98%  Weight: 183 lb (83 kg)  Height: 5' 5.5" (1.664 m)     Body mass index is 29.99 kg/m.   Physical Exam:    Physical Exam Vitals and nursing note reviewed.  Constitutional:      General: She is not in acute distress.    Appearance: She is well-developed. She is not ill-appearing or toxic-appearing.  Cardiovascular:     Rate and Rhythm: Normal rate and regular rhythm.     Pulses: Normal pulses.     Heart sounds: Normal heart sounds, S1 normal and S2 normal.     Comments:  No LE edema Pulmonary:     Effort: Pulmonary effort is normal.     Breath sounds: Normal breath sounds.  Abdominal:     General: Abdomen is flat. Bowel sounds are normal.     Palpations: Abdomen is soft.     Tenderness: There is no abdominal tenderness.  Skin:    General: Skin is warm and dry.  Neurological:     Mental Status: She is alert.     GCS: GCS eye subscore is 4. GCS verbal subscore is 5. GCS motor subscore is 6.  Psychiatric:  Speech: Speech normal.        Behavior: Behavior normal. Behavior is cooperative.     Assessment and Plan:   1. History of drug abuse - cocaine, alcohol and marijuana  ([place]) -- sober since 2008 Sober Encouraged continued sobriety and to reach out if she needs any further support  2. Mood disorder ([place]) Denies concerns today, denies needs for medication or therapy referral I discussed with patient that if they develop any SI, to tell someone immediately and seek medical attention  3. Hyperlipidemia, unspecified hyperlipidemia type Update lipid panel and reassess atorvastatin 40 mg daily She would like to decrease dose or change to lower potency if possible Consider referral to lipid clinic  4. Gastroesophageal reflux disease, unspecified whether esophagitis present Uncontrolled No red flags on discussion Recommend stopping pepcid and trialing protonix 40 mg daily If no improvement, will refer to GI  5. Constipation, unspecified constipation type Continue to push fluids Handout on constipation remedy provided Recommended as follows: -Start colace 100 mg twice daily -If not improving your stools, please add 1 capful of miralax daily -If still no improvement, increase miralax by 1 capful every 3 days with a goal of at least one formed, soft bowel movement daily -If no improvement, will refer to GI  CMA or LPN served as scribe during this visit. History, Physical, and Plan performed by medical provider. The above documentation has been  reviewed and is accurate and complete.  Inda Coke, PA-C [place] [place] 11/02/2020  Follow-up: No follow-ups on file.

## 2020-11-03 ENCOUNTER — Other Ambulatory Visit: Payer: Self-pay | Admitting: Physician Assistant

## 2020-11-03 ENCOUNTER — Encounter: Payer: Self-pay | Admitting: Physician Assistant

## 2020-11-03 MED ORDER — ATORVASTATIN CALCIUM 80 MG PO TABS: 80.0000 mg | ORAL_TABLET | Freq: Every day | ORAL | 1 refills | Status: DC

## 2021-05-16 ENCOUNTER — Other Ambulatory Visit: Payer: Self-pay

## 2021-05-16 ENCOUNTER — Encounter (HOSPITAL_BASED_OUTPATIENT_CLINIC_OR_DEPARTMENT_OTHER): Payer: Self-pay | Admitting: Nurse Practitioner

## 2021-05-16 ENCOUNTER — Ambulatory Visit (INDEPENDENT_AMBULATORY_CARE_PROVIDER_SITE_OTHER): Payer: Managed Care, Other (non HMO) | Admitting: Nurse Practitioner

## 2021-05-16 VITALS — BP 117/72 | HR 90 | Ht 66.0 in | Wt 188.9 lb

## 2021-05-16 DIAGNOSIS — M2559 Pain in other specified joint: Secondary | ICD-10-CM | POA: Diagnosis not present

## 2021-05-16 DIAGNOSIS — M25552 Pain in left hip: Secondary | ICD-10-CM

## 2021-05-16 DIAGNOSIS — M2550 Pain in unspecified joint: Secondary | ICD-10-CM | POA: Insufficient documentation

## 2021-05-16 DIAGNOSIS — Z1321 Encounter for screening for nutritional disorder: Secondary | ICD-10-CM

## 2021-05-16 DIAGNOSIS — M2540 Effusion, unspecified joint: Secondary | ICD-10-CM

## 2021-05-16 DIAGNOSIS — Z13228 Encounter for screening for other metabolic disorders: Secondary | ICD-10-CM

## 2021-05-16 DIAGNOSIS — Z1329 Encounter for screening for other suspected endocrine disorder: Secondary | ICD-10-CM | POA: Diagnosis not present

## 2021-05-16 DIAGNOSIS — Z130 Encounter for screening for diseases of the blood and blood-forming organs and certain disorders involving the immune mechanism: Secondary | ICD-10-CM

## 2021-05-16 MED ORDER — MELOXICAM 15 MG PO TABS: 15.0000 mg | ORAL_TABLET | Freq: Every day | ORAL | 3 refills | Status: AC

## 2021-05-16 NOTE — Progress Notes (Addendum)
?Orma Render, DNP, AGNP-c ?Primary Care & Sports Medicine ?[place] [place] [address] ?[phone] ? ?New patient visit ? ? ?Patient: [name]   DOB: 1976-02-03   46 y.o. Female  MRN: [id number] ?Visit Date: 05/16/2021 ? ?Patient Care Team: ?Makhai Fulco, Coralee Pesa, NP as PCP - General (Nurse Practitioner) ? ?Today's healthcare provider: Orma Render, NP  ? ?Chief Complaint  ?Patient presents with  ? New Patient (Initial Visit)  ?  Patient presents today to establish care. She has lots of pain in left hip, bilateral arms and wrist and shoulder. She wants no pain medication. She would like referral to Dr Janae Bridgeman and labs done today. Would like referral for mammogram  ? ?Subjective  ?  ?[name] is a 46 y.o. female who presents today as a new patient to establish care.  ?  ?Patient endorses the following concerns presently: Joint Pain, chronic and acute.  ?Pain ?Elbow ?- bothering her for many months- worsening ?- worst location- severe in nature ?- bilateral, but right arm is worse than left.  ?- MRI completed before and was told it was "bad tendonitis".  ?- Does a lot of work with her arms in her job ?- pain is constant with no improvement with rest, ice, heat, or use ?- "feels like the muscle is ripping from the bone" on the right arm ?- decreased grip with right hand, often drops items ? ?Shoulder ?- bilateral ?- right worse than left ?- feels this may be related to compensation from the elbow pain ? ?Hip ?- started about 3 months ago  ?- can't lay on left side due to severe pain ?- pain worse with pressure, moving from sitting to standing ?- Pain is present with walking and laying flat is painful but least painful.  ?- Different pain than her shoulders ?- Has been using 2-3 ibuprofen a day, but does not like to take medication.  ?- voltaren gel was helpful, but allergic reaction after 3-4 uses  ?- allergic reaction with topical salon pas, Icy hot, Aspercreme-  cause a red, itchy rash when applied.  ?- Unable to cross her left leg in criss cross position ?- no known injury ?- no repeated use ? ?Has a history of substance abuse and wants to avoid medications at all cost. She is not interested in pain medications or anything that could have the potential of abuse due to her history. She has been sober for 14 years.  ? ?She tells me she has severe sun sensitivity. When outdoors she burns very easily and it is very painful.  ?She tells me she gets a rash across her cheeks and nose with any sun exposure that is very painful.  ? ?History reviewed and reveals the following: ?Past Medical History:  ?Diagnosis Date  ? Depression with anxiety   ? Hyperlipidemia   ? 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 Postpartum hemorrhage   ? 2nd pregnancy  ? UTI (lower urinary tract infection)   ? history of   ? ?Past Surgical History:  ?Procedure Laterality Date  ? LAPAROSCOPIC BILATERAL SALPINGECTOMY  01/19/2015  ? Procedure: LAPAROSCOPIC BILATERAL SALPINGECTOMY with Talbert Forest;  Surgeon: Woodroe Mode, MD;  Location: [facility];  Service: Gynecology;;  ? LAPAROSCOPIC LYSIS OF ADHESIONS  01/19/2015  ? Procedure: LAPAROSCOPIC LYSIS OF ADHESIONS;  Surgeon: Woodroe Mode, MD;  Location: [facility];  Service: Gynecology;;  ? LAPAROSCOPY Bilateral 01/19/2015  ? Procedure: DIAGNOSTIC  LAPAROSCOPY;  Surgeon: Woodroe Mode, MD;  Location: [facility];  Service: Gynecology;  Laterality: Bilateral;  ? TUBAL LIGATION  02/27/2012  ? Procedure: POST PARTUM TUBAL LIGATION;  Surgeon: Woodroe Mode, MD;  Location: [facility];  Service: Gynecology;  Laterality: Bilateral;  ? ?Family Status  ?Relation Name Status  ? Mother  Alive  ? MGM  Deceased  ? Father  Alive  ? MGF  (Not Specified)  ? Mat Aunt  (Not Specified)  ? Mat Uncle  (Not Specified)  ? Mat Aunt  (Not Specified)  ? Other  (Not Specified)  ? ?Family History  ?Problem Relation Age of Onset  ? Drug abuse Maternal Grandmother   ? Cancer Maternal Grandmother   ?      throat, brain  ? Diabetes Father   ? Cancer Father   ?     prostate  ? Stroke Maternal Grandfather   ? Stroke Maternal Aunt   ? CAD Maternal Uncle   ? Diabetes Maternal Aunt   ? Cancer Other   ?     stomach, pancreas (maternal)  ? ?Social History  ? ?Socioeconomic History  ? Marital status: Single  ?  Spouse name: Not on file  ? Number of children: Not on file  ? Years of education: Not on file  ? Highest education level: Not on file  ?Occupational History  ? Not on file  ?Tobacco Use  ? Smoking status: Former  ?  Types: Cigarettes  ?  Quit date: 04/30/2006  ?  Years since quitting: 15.0  ? Smokeless tobacco: Never  ?Vaping Use  ? Vaping Use: Never used  ?Substance and Sexual Activity  ? Alcohol use: No  ?  Alcohol/week: 0.0 standard drinks  ?  Comment: h/o alcohol abuse  ? Drug use: Not Currently  ?  Types: "Crack" cocaine, Marijuana  ?  Comment: h/o MJ use  ? Sexual activity: Yes  ?  Birth control/protection: Surgical  ?  Comment: 2-3 weeks ago  ?Other Topics Concern  ? Not on file  ?Social History Narrative  ? Had 5 children and has had 6 abortions  ? 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 In a relationship  ? ?Social Determinants of Health  ? ?Financial Resource Strain: Not on file  ?Food Insecurity: Not on file  ?Transportation Needs: Not on file  ?Physical Activity: Not on file  ?Stress: Not on file  ?Social Connections: Not on file  ? ?Outpatient Medications Prior to Visit  ?Medication Sig  ? [DISCONTINUED] atorvastatin (LIPITOR) 80 MG tablet Take 1 tablet (80 mg total) by mouth daily.  ? [DISCONTINUED] famotidine (PEPCID) 20 MG tablet Take 1 tablet (20 mg total) by mouth 2 (two) times daily.  ? [DISCONTINUED] pantoprazole (PROTONIX) 40 MG tablet Take 1 tablet (40 mg total) by mouth daily.  ? ?No facility-administered medications prior to visit.  ? ?No Known Allergies ?Immunization History  ?Administered Date(s) Administered  ? Hepatitis B 12/06/2010, 01/11/2011  ? Influenza Split 12/06/2010  ? Influenza  Whole 12/09/2008  ? MMR 12/06/2010, 01/11/2011  ? Moderna Sars-Covid-2 Vaccination 07/14/2019, 08/09/2019  ? PPD Test 12/06/2010  ? Td 01/31/2003  ? Tdap 12/06/2010  ? ? ?Health Maintenance Due: ?Health Maintenance  ?Topic Date Due  ? COVID-19 Vaccine (3 - Booster for Moderna series) 10/04/2019  ? TETANUS/TDAP  12/05/2020  ? PAP SMEAR-Modifier  04/05/2025  ? Hepatitis C Screening  Completed  ? HIV Screening  Completed  ?  HPV VACCINES  Aged Out  ? INFLUENZA VACCINE  Discontinued  ? ? ?Review of Systems ?All review of systems negative except what is listed in the HPI ? ? Objective  ?  ?BP 117/72   Pulse 90   Ht _0  (1.676 m)   Wt 188 lb 14.4 oz (85.7 kg)   SpO2 99%   BMI 30.49 kg/m?  ?Physical Exam ?Vitals and nursing note reviewed.  ?Constitutional:   ?   General: She is not in acute distress. ?   Appearance: Normal appearance.  ?Eyes:  ?   Extraocular Movements: Extraocular movements intact.  ?   Conjunctiva/sclera: Conjunctivae normal.  ?   Pupils: Pupils are equal, round, and reactive to light.  ?Neck:  ?   Vascular: No carotid bruit.  ?Cardiovascular:  ?   Rate and Rhythm: Normal rate and regular rhythm.  ?   Pulses: Normal pulses.  ?   Heart sounds: Normal heart sounds. No murmur heard. ?Pulmonary:  ?   Effort: Pulmonary effort is normal.  ?   Breath sounds: Normal breath sounds. No wheezing.  ?Abdominal:  ?   General: Bowel sounds are normal.  ?   Palpations: Abdomen is soft.  ?Musculoskeletal:     ?   General: Tenderness present.  ?   Right shoulder: Tenderness present. No swelling, deformity, bony tenderness or crepitus. Decreased range of motion. Decreased strength. Normal pulse.  ?   Left shoulder: Tenderness present. No swelling, deformity, bony tenderness or crepitus. Decreased range of motion. Decreased strength. Normal pulse.  ?   Right upper arm: Tenderness present. No bony tenderness.  ?   Left upper arm: Tenderness present. No bony tenderness.  ?   Right elbow: No swelling, deformity or  effusion. Normal range of motion. No tenderness.  ?   Left elbow: No swelling, deformity or effusion. Normal range of motion. No tenderness.  ?   Right forearm: Tenderness present. No swelling, deformity or bony ten

## 2021-05-16 NOTE — Assessment & Plan Note (Signed)
Suspect trochanteric bursitis. Will send referral to orthopedics and obtain labs for further evaluation.  ?

## 2021-05-16 NOTE — Assessment & Plan Note (Signed)
Strongly suspect trochanteric bursitis based on symptoms and presentation.  ?Mild edema noted over left hip. Erythema present from recent allergy to salon pas- no warmth or drainage present.  ?Discomfort noted with palpation to the right trochanter. Gait intact. Pulses intact. Moderate weakness noted.  ?Recommend meloxicam, ice, and heat and referral to orthopedics for further evaluation and recommendations. She may benefit from joint injection.  ?Will obtain labs today to rule out infection present, as well.  ? ?

## 2021-05-16 NOTE — Assessment & Plan Note (Signed)
Joint pain in the shoulders, elbows, wrists, and right 5th finger. Right side is worse than the left.  ?There is no visible deformity of the shoulders, elbows, or wrirsts present. Active ROM limited by pain for abduction, adduction, and external rotation. Passive ROM limited by pain.  ?5th finger of right hand shows mild joint deformity of the pip. Consider possible osteoarthritis.  ?Strength weakened on right > left. Pulses, coloration, temperature equal bilaterally.  ?Given the widespread pain consider possible rheumatologic/autoimmune etiology could be present. Will obtain labs today for further evaluation.  ?Also consider possible fibromyalgia.  ?Recommend starting with daily meloxicam to see if this is helpful with symptoms while we await labs. May benefit from PT. Consider cymbalta for widespread pain control to avoid use of medications with addictive potential.  ?Patient is being responsible with her choices about management of her pain and wishes to avoid controlled substances- I appreciate her efforts to maintain her sobriety and will work with her to help her control her pain in manners that do not risk interfering with this.  ?  ? ? ?

## 2021-05-16 NOTE — Patient Instructions (Addendum)
Thank you for choosing [place] at [facility] for your Primary Care needs. I am excited for the opportunity to partner with you to meet your health care goals. It was a pleasure meeting you today! ? ?Recommendations from today's visit: ?We will get some labs today to see if we can determine a cause of your pain- I am going to order some labs that will look to see if there are any autoimmune components that could be the cause.  ?I do think your hip pain is caused by bursitis. This can be very painful. I have included information on this condition on the handout at the back of this sheet.  ?I have sent a medication called Meloxicam in to the pharmacy for you. This is taken one time a day and is used to reduce inflammation. It should be helpful to ease your pain some, at least until we have more answers with the lab work. ?I am going to send a referral to Dr. Sammuel Hines for evaluation of the bursitis, while we wait for the lab results. He may be able to help with recommendations for the other joints, but specifically I think he can help with the hip.  ? ?Information on diet, exercise, and health maintenance recommendations are listed below. This is information to help you be sure you are on track for optimal health and monitoring.  ? ?Please look over this and let us know if you have any questions or if you have completed any of the health maintenance outside of [place] so that we can be sure your records are up to date.  ?___________________________________________________________ ?About Me: ?I am an Adult-Geriatric Nurse Practitioner with a background in caring for patients for more than 20 years with a strong intensive care background. I provide primary care and sports medicine services to patients age 51 and older within this office. My education had a strong focus on caring for the older adult population, which I am passionate about. I am also the director of the APP Fellowship with [facility].  ? ?My desire is to provide you with the best service through preventive medicine and supportive care. I consider you a part of the medical team and value your input. I work diligently to ensure that you are heard and your needs are met in a safe and effective manner. I want you to feel comfortable with me as your provider and want you to know that your health concerns are important to me. ? ?For your information, our office hours are: ?Monday, Tuesday, and Thursday 8:00 AM - 5:00 PM ?Wednesday and Friday 8:00 AM - 12:00 PM.  ? ?In my time away from the office I am teaching new APP's within the system and am unavailable, but my partner, Dr. Burnard Bunting is in the office for emergent needs.  ? ?If you have questions or concerns, please call our office at [phone] or send [place] a MyChart message and we will respond as quickly as possible.  ?____________________________________________________________ ?MyChart:  ?For all urgent or time sensitive needs we ask that you please call the office to avoid delays. Our number is [phone]. ?MyChart is not constantly monitored and due to the large volume of messages a day, replies may take up to 72 business hours. ? ?MyChart Policy: ?MyChart allows for you to see your visit notes, after visit summary, provider recommendations, lab and tests results, make an appointment, request refills, and contact your provider or the office for non-urgent questions or  concerns. Providers are seeing patients during normal business hours and do not have built in time to review MyChart messages.  ?We ask that you allow a minimum of 3 business days for responses to [employer]. For this reason, please do not send urgent requests through [employer]. Please call the office at [phone]. ?New and ongoing conditions may require a visit. We have virtual and in person visit available for your convenience.  ?Complex MyChart concerns may require a visit. Your provider may request you schedule  a virtual or in person visit to ensure we are providing the best care possible. ?MyChart messages sent after 11:00 AM on Friday will not be received by the provider until Monday morning.  ?  ?Lab and Test Results: ?You will receive your lab and test results on MyChart as soon as they are completed and results have been sent by the lab or testing facility. Due to this service, you will receive your results BEFORE your provider.  ?I review lab and tests results each morning prior to seeing patients. Some results require collaboration with other providers to ensure you are receiving the most appropriate care. For this reason, we ask that you please allow a minimum of 3-5 business days from the time the ALL results have been received for your provider to receive and review lab and test results and contact you about these.  ?Most lab and test result comments from the provider will be sent through [place]. Your provider may recommend changes to the plan of care, follow-up visits, repeat testing, ask questions, or request an office visit to discuss these results. You may reply directly to this message or call the office at [phone] to provide information for the provider or set up an appointment. ?In some instances, you will be called with test results and recommendations. Please let us know if this is preferred and we will make note of this in your chart to provide this for you.    ?If you have not heard a response to your lab or test results in 5 business days from all results returning to [place], please call the office to let us know. We ask that you please avoid calling prior to this time unless there is an emergent concern. Due to high call volumes, this can delay the resulting process. ? ?After Hours: ?For all non-emergency after hours needs, please call the office at [phone] and select the option to reach the on-call provider service. On-call services are shared between multiple [place] offices and  therefore it will not be possible to speak directly with your provider. On-call providers may provide medical advice and recommendations, but are unable to provide refills for maintenance medications.  ?For all emergency or urgent medical needs after normal business hours, we recommend that you seek care at the closest Urgent Care or Emergency Department to ensure appropriate treatment in a timely manner.  ?[facility] [place] has a 24 hour emergency room located on the ground floor for your convenience.  ? ?Urgent Concerns During the Business Day ?Providers are seeing patients from 8AM to [place] with a busy schedule and are most often not able to respond to non-urgent calls until the end of the day or the next business day. ?If you should have URGENT concerns during the day, please call and speak to the nurse or schedule a same day appointment so that we can address your concern without delay.  ? ?Thank you, again, for choosing me as your health care  partner. I appreciate your trust and look forward to learning more about you.  ? ?Stacy Keeler, DNP, AGNP-c ?___________________________________________________________ ? ?Health Maintenance Recommendations ?Screening Testing ?Mammogram ?Every 1 -2 years based on history and risk factors ?Starting at age 51 ?Pap Smear ?Ages 21-39 every 3 years ?Ages 34-65 every 5 years with HPV testing ?More frequent testing may be required based on results and history ?Colon Cancer Screening ?Every 1-10 years based on test performed, risk factors, and history ?Starting at age 79 ?Bone Density Screening ?Every 2-10 years based on history ?Starting at age 67 for women ?Recommendations for men differ based on medication usage, history, and risk factors ?AAA Screening ?One time ultrasound ?Men 19-21 years old who have every smoked ?Lung Cancer Screening ?Low Dose Lung CT every 12 months ?Age 71-80 years with a 30 pack-year smoking history who still smoke or who have quit  within the last 15 years ? ?Screening Labs ?Routine  Labs: Complete Blood Count (CBC), Complete Metabolic Panel (CMP), Cholesterol (Lipid Panel) ?Every 6-12 months based on history and medications ?May be r

## 2021-05-17 ENCOUNTER — Other Ambulatory Visit (HOSPITAL_BASED_OUTPATIENT_CLINIC_OR_DEPARTMENT_OTHER): Payer: Self-pay | Admitting: Orthopaedic Surgery

## 2021-05-17 DIAGNOSIS — M25559 Pain in unspecified hip: Secondary | ICD-10-CM

## 2021-05-17 LAB — CBC WITH DIFFERENTIAL/PLATELET
Basophils Absolute: 0.1 10*3/uL (ref 0.0–0.2)
Basos: 1 %
EOS (ABSOLUTE): 0.2 10*3/uL (ref 0.0–0.4)
Eos: 3 %
Hematocrit: 41.9 % (ref 34.0–46.6)
Hemoglobin: 14.1 g/dL (ref 11.1–15.9)
Immature Grans (Abs): 0 10*3/uL (ref 0.0–0.1)
Immature Granulocytes: 0 %
Lymphocytes Absolute: 2.4 10*3/uL (ref 0.7–3.1)
Lymphs: 31 %
MCH: 28.5 pg (ref 26.6–33.0)
MCHC: 33.7 g/dL (ref 31.5–35.7)
MCV: 85 fL (ref 79–97)
Monocytes Absolute: 0.7 10*3/uL (ref 0.1–0.9)
Monocytes: 9 %
Neutrophils Absolute: 4.5 10*3/uL (ref 1.4–7.0)
Neutrophils: 56 %
Platelets: 349 10*3/uL (ref 150–450)
RBC: 4.95 x10E6/uL (ref 3.77–5.28)
RDW: 12.6 % (ref 11.7–15.4)
WBC: 7.9 10*3/uL (ref 3.4–10.8)

## 2021-05-17 LAB — RHEUMATOID FACTOR: Rheumatoid fact SerPl-aCnc: 10.6 IU/mL (ref <14.0)

## 2021-05-17 LAB — LIPID PANEL
Chol/HDL Ratio: 5.6 ratio — ABNORMAL HIGH (ref 0.0–4.4)
Cholesterol, Total: 320 mg/dL — ABNORMAL HIGH (ref 100–199)
HDL: 57 mg/dL (ref >39)
LDL Chol Calc (NIH): 191 mg/dL — ABNORMAL HIGH (ref 0–99)
Triglycerides: 362 mg/dL — ABNORMAL HIGH (ref 0–149)
VLDL Cholesterol Cal: 72 mg/dL — ABNORMAL HIGH (ref 5–40)

## 2021-05-17 LAB — T3: T3, Total: 121 ng/dL (ref 71–180)

## 2021-05-17 LAB — COMPREHENSIVE METABOLIC PANEL
ALT: 27 IU/L (ref 0–32)
AST: 25 IU/L (ref 0–40)
Albumin/Globulin Ratio: 1.9 (ref 1.2–2.2)
Albumin: 4.5 g/dL (ref 3.8–4.8)
Alkaline Phosphatase: 91 IU/L (ref 44–121)
BUN/Creatinine Ratio: 17 (ref 9–23)
BUN: 13 mg/dL (ref 6–24)
Bilirubin Total: 0.2 mg/dL (ref 0.0–1.2)
CO2: 23 mmol/L (ref 20–29)
Calcium: 9.3 mg/dL (ref 8.7–10.2)
Chloride: 101 mmol/L (ref 96–106)
Creatinine, Ser: 0.76 mg/dL (ref 0.57–1.00)
Globulin, Total: 2.4 g/dL (ref 1.5–4.5)
Glucose: 86 mg/dL (ref 70–99)
Potassium: 4.3 mmol/L (ref 3.5–5.2)
Sodium: 141 mmol/L (ref 134–144)
Total Protein: 6.9 g/dL (ref 6.0–8.5)
eGFR: 100 mL/min/{1.73_m2} (ref >59)

## 2021-05-17 LAB — B12 AND FOLATE PANEL
Folate: 14.5 ng/mL (ref >3.0)
Vitamin B-12: 510 pg/mL (ref 232–1245)

## 2021-05-17 LAB — TSH: TSH: 2.26 u[IU]/mL (ref 0.450–4.500)

## 2021-05-17 LAB — HEMOGLOBIN A1C
Est. average glucose Bld gHb Est-mCnc: 108 mg/dL
Hgb A1c MFr Bld: 5.4 % (ref 4.8–5.6)

## 2021-05-17 LAB — IRON,TIBC AND FERRITIN PANEL
Ferritin: 63 ng/mL (ref 15–150)
Iron Saturation: 23 % (ref 15–55)
Iron: 76 ug/dL (ref 27–159)
Total Iron Binding Capacity: 329 ug/dL (ref 250–450)
UIBC: 253 ug/dL (ref 131–425)

## 2021-05-17 LAB — T4: T4, Total: 7.9 ug/dL (ref 4.5–12.0)

## 2021-05-17 LAB — C-REACTIVE PROTEIN: CRP: 1 mg/L (ref 0–10)

## 2021-05-17 LAB — VITAMIN D 25 HYDROXY (VIT D DEFICIENCY, FRACTURES): Vit D, 25-Hydroxy: 12.6 ng/mL — ABNORMAL LOW (ref 30.0–100.0)

## 2021-05-18 ENCOUNTER — Ambulatory Visit (INDEPENDENT_AMBULATORY_CARE_PROVIDER_SITE_OTHER): Payer: Managed Care, Other (non HMO) | Admitting: Orthopaedic Surgery

## 2021-05-18 ENCOUNTER — Other Ambulatory Visit: Payer: Self-pay

## 2021-05-18 ENCOUNTER — Ambulatory Visit (HOSPITAL_BASED_OUTPATIENT_CLINIC_OR_DEPARTMENT_OTHER)
Admission: RE | Admit: 2021-05-18 | Discharge: 2021-05-18 | Disposition: A | Discharge status: Home / Self Care | Payer: Managed Care, Other (non HMO) | Source: Ambulatory Visit | Attending: Orthopaedic Surgery | Admitting: Orthopaedic Surgery

## 2021-05-18 DIAGNOSIS — S76012A Strain of muscle, fascia and tendon of left hip, initial encounter: Secondary | ICD-10-CM

## 2021-05-18 DIAGNOSIS — M25559 Pain in unspecified hip: Secondary | ICD-10-CM | POA: Insufficient documentation

## 2021-05-18 NOTE — Progress Notes (Signed)
? ?                            ? ? ?Chief Complaint: Left hip pain ?  ? ? ?History of Present Illness:  ? ? ?[name] is a 46 y.o. female presents with left hip pain which been going on for 3 months.  She has not had any specific injury on his hip.  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  She previously has worked in Visual merchandiser jobs.  She has taken ibuprofen and more recently Mobic for the pain which causes her to have some pain relief but overall this has been very bothersome to her stomach. She has a difficult time laying directly on the side.  She has a difficult time getting up and down the steps as well.  She does feel weak with regard to this hip.  Initiation of pain is located on the lateral aspect of the hip. ? ? ? ?Surgical History:   ?None ? ?PMH/PSH/Family History/Social History/Meds/Allergies:   ? ?Past Medical History:  ?Diagnosis Date  ? Depression with anxiety   ? Hyperlipidemia   ? 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 Postpartum hemorrhage   ? 2nd pregnancy  ? UTI (lower urinary tract infection)   ? history of   ? ?Past Surgical History:  ?Procedure Laterality Date  ? LAPAROSCOPIC BILATERAL SALPINGECTOMY  01/19/2015  ? Procedure: LAPAROSCOPIC BILATERAL SALPINGECTOMY with Talbert Forest;  Surgeon: Woodroe Mode, MD;  Location: [facility];  Service: Gynecology;;  ? LAPAROSCOPIC LYSIS OF ADHESIONS  01/19/2015  ? Procedure: LAPAROSCOPIC LYSIS OF ADHESIONS;  Surgeon: Woodroe Mode, MD;  Location: [facility];  Service: Gynecology;;  ? LAPAROSCOPY Bilateral 01/19/2015  ? Procedure: DIAGNOSTIC LAPAROSCOPY;  Surgeon: Woodroe Mode, MD;  Location: [facility];  Service: Gynecology;  Laterality: Bilateral;  ? TUBAL LIGATION  02/27/2012  ? Procedure: POST PARTUM TUBAL LIGATION;  Surgeon: Woodroe Mode, MD;  Location: [facility];  Service: Gynecology;  Laterality: Bilateral;  ? ?Social History  ? ?Socioeconomic History  ? Marital status: Single  ?  Spouse name: Not on file  ? Number of children: Not  on file  ? Years of education: Not on file  ? Highest education level: Not on file  ?Occupational History  ? Not on file  ?Tobacco Use  ? Smoking status: Former  ?  Types: Cigarettes  ?  Quit date: 04/30/2006  ?  Years since quitting: 15.0  ? Smokeless tobacco: Never  ?Vaping Use  ? Vaping Use: Never used  ?Substance and Sexual Activity  ? Alcohol use: No  ?  Alcohol/week: 0.0 standard drinks  ?  Comment: h/o alcohol abuse  ? Drug use: Not Currently  ?  Types: "Crack" cocaine, Marijuana  ?  Comment: h/o MJ use  ? Sexual activity: Yes  ?  Birth control/protection: Surgical  ?  Comment: 2-3 weeks ago  ?Other Topics Concern  ? Not on file  ?Social History Narrative  ? Had 5 children and has had 6 abortions  ? 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 In a relationship  ? ?Social Determinants of Health  ? ?Financial Resource Strain: Not on file  ?Food Insecurity: Not on file  ?Transportation Needs: Not on file  ?Physical Activity: Not on file  ?Stress: Not on file  ?Social Connections: Not on file  ? ?Family History  ?Problem Relation Age  of Onset  ? Drug abuse Maternal Grandmother   ? Cancer Maternal Grandmother   ?     throat, brain  ? Diabetes Father   ? Cancer Father   ?     prostate  ? Stroke Maternal Grandfather   ? Stroke Maternal Aunt   ? CAD Maternal Uncle   ? Diabetes Maternal Aunt   ? Cancer Other   ?     stomach, pancreas (maternal)  ? ?No Known Allergies ?Current Outpatient Medications  ?Medication Sig Dispense Refill  ? meloxicam (MOBIC) 15 MG tablet Take 1 tablet (15 mg total) by mouth daily. 30 tablet 3  ? ?No current facility-administered medications for this visit.  ? ?No results found. ? ?Review of Systems:   ?A ROS was performed including pertinent positives and negatives as documented in the HPI. ? ?Physical Exam :   ?Constitutional: NAD and appears stated age ?Neurological: Alert and oriented ?Psych: Appropriate affect and cooperative ?There were no vitals taken for this visit.  ? ?Comprehensive  Musculoskeletal Exam:   ? ?Inspection Right Left  ?Skin No atrophy or gross abnormalities appreciated No atrophy or gross abnormalities appreciated  ?Palpation    ?Tenderness None None  ?Crepitus None None  ?Range of Motion    ?Flexion (passive) 120 120  ?Extension 30 30  ?IR 30 30  ?ER 45 45  ?Strength    ?Flexion  5/5 5/5  ?Extension 5/5 5/5  ?Special Tests    ?FABIR Negative Negative  ?FADER Negative Negative  ?ER Lag/Capsular Insufficiency Negative Negative  ?Instability Negative Negative  ?Sacroiliac pain Negative  Negative   ?Instability    ?Generalized Laxity No No  ?Neurologic    ?sciatic, femoral, obturator nerves intact to light sensation  ?Vascular/Lymphatic    ?DP pulse 2+ 2+  ?Lumbar Exam    ?Patient has symmetric lumbar range of motion with negative pain referral to hip  ? ?Positive Trendelenburg gait on the left with weakness with resisted hip abduction ? ?Imaging:   ?Xray (AP pelvis 2 views left hip): ?Normal ? ?I personally reviewed and interpreted the radiographs. ? ? 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  Overall I have recommended a gluteal strengthening program given her Trendelenburg gait and overall weakness.  I have also offered her an ultrasound-guided injection about this left hip but she would like to defer this today as she has a fear of needles.  I will plan to refer her for gluteal strengthening program of the left hip and I will see her back in 6 weeks.  I will see her back sooner should she want to consider an ultrasound-guided injection of the left hip ?Plan :   ? ?-Plan for physical therapy for gluteal strengthening program ?-Return to clinic in 6 weeks for reassessment ? ? ? ? ?I personally saw and evaluated the patient, and participated in the management and treatment plan. ? ?Vanetta Mulders, MD ?Attending Physician, Orthopedic Surgery ? ?This document was dictated using TEFL teacher. A reasonable attempt at proof reading has been made to minimize errors. ?

## 2021-05-23 NOTE — Progress Notes (Signed)
Virtual visit set for 3/30 at 3:50 ?

## 2021-05-26 ENCOUNTER — Encounter (HOSPITAL_BASED_OUTPATIENT_CLINIC_OR_DEPARTMENT_OTHER): Payer: Self-pay | Admitting: Nurse Practitioner

## 2021-05-26 ENCOUNTER — Ambulatory Visit (INDEPENDENT_AMBULATORY_CARE_PROVIDER_SITE_OTHER): Payer: Managed Care, Other (non HMO) | Admitting: Nurse Practitioner

## 2021-05-26 DIAGNOSIS — E7801 Familial hypercholesterolemia: Secondary | ICD-10-CM

## 2021-05-26 DIAGNOSIS — E559 Vitamin D deficiency, unspecified: Secondary | ICD-10-CM

## 2021-05-26 DIAGNOSIS — R197 Diarrhea, unspecified: Secondary | ICD-10-CM | POA: Diagnosis not present

## 2021-05-26 MED ORDER — VITAMIN D (ERGOCALCIFEROL) 1.25 MG (50000 UNIT) PO CAPS: 50000.0000 [IU] | ORAL_CAPSULE | ORAL | 2 refills | Status: AC

## 2021-05-26 MED ORDER — ROSUVASTATIN CALCIUM 10 MG PO TABS: 10.0000 mg | ORAL_TABLET | Freq: Every day | ORAL | 3 refills | Status: AC

## 2021-05-26 NOTE — Progress Notes (Signed)
Virtual Visit Encounter telephone visit. ? ? ?I connected with  [name] on 06/01/21 at  3:50 PM EDT by secure audio telemedicine application. I verified that I am speaking with the correct person using two identifiers. ?  ?I introduced myself as a Designer, jewellery with the practice. The limitations of evaluation and management by telemedicine discussed with the patient and the availability of in person appointments. The patient expressed verbal understanding and consent to proceed. ? ?Participating parties in this visit include: Myself and patient ? ?The patient is: Patient Location: Home ?I am: Provider Location: Office/Clinic ?Subjective:   ? ?CC and HPI: [name] is a 46 y.o. year old female presenting for follow up of labs. ?Vitamin D levels low on most recent labs. Patient in agreement to take high dose vitamin D supplement to get this level up to normal.  ?Cholesterol elevated. She endorses a family history of elevated cholesterol and CVD.  ?No concerning symptoms present. No way to take BP at home.  ? ?Past medical history, Surgical history, Family history not pertinant except as noted below, Social history, Allergies, and medications have been entered into the medical record, reviewed, and corrections made.  ? ?Review of Systems:  ?All review of systems negative except what is listed in the HPI ? ?Objective:   ? ?Alert and oriented x 4 ?Speaking in clear sentences with no shortness of breath. ?No distress. ? ?Impression and Recommendations:   ? ?Problem List Items Addressed This Visit   ? ? Hyperlipidemia - Primary  ?  Plan to start rosuvastatin low dose at bedtime and monitor. Will need repeat cholesterol and liver levels in 3 months to ensure that dosage is appropriate. Monitor diet and reduce saturated fats to no more than 13g a day. Likely familial origin. Recommend close BP monitoring and notification if any side effects present from medication.  ?  ?  ? Relevant Medications  ?  rosuvastatin (CRESTOR) 10 MG tablet  ? Vitamin D deficiency  ?  HD vitamin D supplement for 12 weeks then plan to recheck labs to ensure levels have returned to normal. Vitamin D levels have been low historically.  ? ?  ?  ? Relevant Medications  ? Vitamin D, Ergocalciferol, (DRISDOL) 1.25 MG (50000 UNIT) CAPS capsule  ? Diarrhea  ? ? ?orders and follow up as documented in EMR ?I discussed the assessment and treatment plan with the patient. The patient was provided an opportunity to ask questions and all were answered. The patient agreed with the plan and demonstrated an understanding of the instructions. ?  ?The patient was advised to call back or seek an in-person evaluation if the symptoms worsen or if the condition fails to improve as anticipated. ? ?Follow-Up: in 3 months ? ?I provided 18 minutes of non-face-to-face interaction with this non face-to-face encounter including intake, same-day documentation, and chart review.  ? ?Orma Render, NP , DNP, AGNP-c ?[facility] ?Primary Care & Sports Medicine at [facility] ?[phone] ?[phone] (fax) ? ?

## 2021-05-26 NOTE — Patient Instructions (Addendum)
I have sent in rosuvastatin '10mg'$  at bedtime nightly ? ?I have sent in vitamin d you will take it once a week ? ?Try the imodium once a day to see if this helps with your bowel movements.  ? ?If you have any problems, let me know.  ?

## 2021-06-01 NOTE — Assessment & Plan Note (Signed)
Plan to start rosuvastatin low dose at bedtime and monitor. Will need repeat cholesterol and liver levels in 3 months to ensure that dosage is appropriate. Monitor diet and reduce saturated fats to no more than 13g a day. Likely familial origin. Recommend close BP monitoring and notification if any side effects present from medication.  ?

## 2021-06-01 NOTE — Assessment & Plan Note (Signed)
HD vitamin D supplement for 12 weeks then plan to recheck labs to ensure levels have returned to normal. Vitamin D levels have been low historically.  ? ?

## 2021-06-22 ENCOUNTER — Ambulatory Visit (HOSPITAL_BASED_OUTPATIENT_CLINIC_OR_DEPARTMENT_OTHER): Payer: Managed Care, Other (non HMO) | Attending: Orthopaedic Surgery | Admitting: Physical Therapy

## 2021-06-22 NOTE — Therapy (Incomplete)
?OUTPATIENT PHYSICAL THERAPY LOWER EXTREMITY EVALUATION ? ? ?Patient Name: [name] ?MRN: [id number] ?DOB:07-22-1975, 46 y.o., female ?Today's Date: 06/22/2021 ? ? ? ?Past Medical History:  ?Diagnosis Date  ? Depression with anxiety   ? Hyperlipidemia   ? 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 Postpartum hemorrhage   ? 2nd pregnancy  ? UTI (lower urinary tract infection)   ? history of   ? ?Past Surgical History:  ?Procedure Laterality Date  ? LAPAROSCOPIC BILATERAL SALPINGECTOMY  01/19/2015  ? Procedure: LAPAROSCOPIC BILATERAL SALPINGECTOMY with Talbert Forest;  Surgeon: Woodroe Mode, MD;  Location: [facility];  Service: Gynecology;;  ? LAPAROSCOPIC LYSIS OF ADHESIONS  01/19/2015  ? Procedure: LAPAROSCOPIC LYSIS OF ADHESIONS;  Surgeon: Woodroe Mode, MD;  Location: [facility];  Service: Gynecology;;  ? LAPAROSCOPY Bilateral 01/19/2015  ? Procedure: DIAGNOSTIC LAPAROSCOPY;  Surgeon: Woodroe Mode, MD;  Location: [facility];  Service: Gynecology;  Laterality: Bilateral;  ? TUBAL LIGATION  02/27/2012  ? Procedure: POST PARTUM TUBAL LIGATION;  Surgeon: Woodroe Mode, MD;  Location: [facility];  Service: Gynecology;  Laterality: Bilateral;  ? ?Patient Active Problem List  ? Diagnosis Date Noted  ? Diarrhea 05/26/2021  ? Joint pain 05/16/2021  ? Hip pain, left 05/16/2021  ? History of drug abuse - cocaine, alcohol and marijuana  ([place]) -- sober since 2008 11/02/2020  ? Gastroesophageal reflux disease 07/30/2020  ? Vitamin D deficiency 07/30/2020  ? Joint swelling 04/05/2020  ? Carpal tunnel syndrome 09/28/2017  ? Mood disorder ([place]) 02/04/2015  ? Hyperlipidemia 05/22/2012  ? Generalized anxiety disorder 03/28/2010  ? ? ?PCP: Orma Render, NP ? ?REFERRING PROVIDER: Vanetta Mulders, MD ? ?REFERRING DIAG: M25.559 (ICD-10-CM) - Hip pain ? ?THERAPY DIAG:  ?No diagnosis found. ? ?ONSET DATE: *** ? ?SUBJECTIVE:  ? ?SUBJECTIVE STATEMENT: ?*** ? ?PERTINENT HISTORY: ?*** ? ?PAIN:  ?Are you having pain? {OPRCPAIN:27236} ? ?PRECAUTIONS:  {Therapy precautions:24002} ? ?WEIGHT BEARING RESTRICTIONS {Yes ***/No:24003} ? ?FALLS:  ?Has patient fallen in last 6 months? {fallsyesno:27318} ? ?LIVING ENVIRONMENT: ?Lives with: {OPRC lives with:25569::"lives with their family"} ?Lives in: {Lives in:25570} ?Stairs: {opstairs:27293} ?Has following equipment at home: {Assistive devices:23999} ? ?OCCUPATION: *** ? ?PLOF: {PLOF:24004} ? ?PATIENT GOALS *** ? ? ?OBJECTIVE:  ? ?DIAGNOSTIC FINDINGS: xray 05/18/21: ?There is no evidence of hip fracture or dislocation. There is no ?evidence of arthropathy or other focal bone abnormality. ? ?PATIENT SURVEYS:  ?FOTO *** ? ?COGNITION: ? Overall cognitive status: {cognition:24006}   ?  ?SENSATION: ?{sensation:27233} ? ?MUSCLE LENGTH: ?Hamstrings: Right *** deg; Left *** deg ?Thomas test: Right *** deg; Left *** deg ? ?POSTURE:  ?*** ? ?PALPATION: ?*** ? ?LE ROM: ? ?{AROM/PROM:27142} ROM Right ?06/22/2021 Left ?06/22/2021  ?Hip flexion    ?Hip extension    ?Hip abduction    ?Hip adduction    ?Hip internal rotation    ?Hip external rotation    ?Knee flexion    ?Knee extension    ?Ankle dorsiflexion    ?Ankle plantarflexion    ?Ankle inversion    ?Ankle eversion    ? (Blank rows = not tested) ? ?LE MMT: ? ?MMT Right ?06/22/2021 Left ?06/22/2021  ?Hip flexion    ?Hip extension    ?Hip abduction    ?Hip adduction    ?Hip internal rotation    ?Hip external rotation    ?Knee flexion    ?Knee extension    ?Ankle dorsiflexion    ?Ankle plantarflexion    ?Ankle inversion    ?  Ankle eversion    ? (Blank rows = not tested) ? ?LOWER EXTREMITY SPECIAL TESTS:  ?{LEspecialtests:26242} ? ?FUNCTIONAL TESTS:  ?{Functional tests:24029} ? ?GAIT: ?Distance walked: *** ?Assistive device utilized: {Assistive devices:23999} ?Level of assistance: {Levels of assistance:24026} ?Comments: *** ? ? ? ?TODAY'S TREATMENT: ?*** ? ? ?PATIENT EDUCATION:  ?Education details: Anatomy of condition, POC, HEP, exercise form/rationale ? ?Person educated: Patient ?Education  method: Explanation, Demonstration, Tactile cues, Verbal cues, and Handouts ?Education comprehension: verbalized understanding, returned demonstration, verbal cues required, tactile cues required, and needs further education ? ? ?HOME EXERCISE PROGRAM: ?*** ? ?ASSESSMENT: ? ?CLINICAL IMPRESSION: ?Patient is a 46 y.o. F who was seen today for physical therapy evaluation and treatment for Left hip pain.  ? ? ?OBJECTIVE IMPAIRMENTS {opptimpairments:25111}.  ? ?ACTIVITY LIMITATIONS {activity limitations:25113}.  ? ?PERSONAL FACTORS {Personal factors:25162} are also affecting patient's functional outcome.  ? ? ?REHAB POTENTIAL: {rehabpotential:25112} ? ?CLINICAL DECISION MAKING: {clinical decision making:25114} ? ?EVALUATION COMPLEXITY: {Evaluation complexity:25115} ? ? ?GOALS: ?Goals reviewed with patient? {yes/no:20286} ? ?SHORT TERM GOALS: Target date: {follow up:25551} ? ?*** ?Baseline: ?Goal status: {GOALSTATUS:25110} ? ?2.  *** ?Baseline:  ?Goal status: {GOALSTATUS:25110} ? ?3.  *** ?Baseline:  ?Goal status: {GOALSTATUS:25110} ? ?4.  *** ?Baseline:  ?Goal status: {GOALSTATUS:25110} ? ?5.  *** ?Baseline:  ?Goal status: {GOALSTATUS:25110} ? ?6.  *** ?Baseline:  ?Goal status: {GOALSTATUS:25110} ? ?LONG TERM GOALS: Target date: {follow up:25551} ? ?*** ?Baseline:  ?Goal status: {GOALSTATUS:25110} ? ?2.  *** ?Baseline:  ?Goal status: {GOALSTATUS:25110} ? ?3.  *** ?Baseline:  ?Goal status: {GOALSTATUS:25110} ? ?4.  *** ?Baseline:  ?Goal status: {GOALSTATUS:25110} ? ?5.  *** ?Baseline:  ?Goal status: {GOALSTATUS:25110} ? ?6.  *** ?Baseline:  ?Goal status: {GOALSTATUS:25110} ? ? ?PLAN: ?PT FREQUENCY: {rehab frequency:25116} ? ?PT DURATION: {rehab duration:25117} ? ?PLANNED INTERVENTIONS: {rehab planned interventions:25118::"Therapeutic exercises","Therapeutic activity","Neuromuscular re-education","Balance training","Gait training","Patient/Family education","Joint mobilization"} ? ?PLAN FOR NEXT SESSION: *** ? ? ?Selinda Eon, PT ?06/22/2021, 2:28 PM  ?

## 2021-06-29 ENCOUNTER — Ambulatory Visit (HOSPITAL_BASED_OUTPATIENT_CLINIC_OR_DEPARTMENT_OTHER): Payer: Managed Care, Other (non HMO) | Admitting: Orthopaedic Surgery

## 2021-09-02 ENCOUNTER — Encounter (HOSPITAL_BASED_OUTPATIENT_CLINIC_OR_DEPARTMENT_OTHER): Payer: Self-pay | Admitting: Nurse Practitioner

## 2021-09-14 ENCOUNTER — Ambulatory Visit (HOSPITAL_BASED_OUTPATIENT_CLINIC_OR_DEPARTMENT_OTHER): Payer: Managed Care, Other (non HMO) | Admitting: Nurse Practitioner

## 2021-11-24 ENCOUNTER — Telehealth: Payer: Self-pay | Admitting: Orthopaedic Surgery

## 2021-11-24 NOTE — Telephone Encounter (Signed)
CD is ready! 

## 2021-11-24 NOTE — Telephone Encounter (Signed)
Received call from pt. Requesting copy of notes and xrays. Please copy 05/18/21 to CD. Please let me know when ready. Thank you

## 2021-11-25 NOTE — Telephone Encounter (Signed)
IC patient, advised CD & notes are ready to pickup. She will sign auth when comes in to pickup.

## 2022-07-14 IMAGING — DX DG HIP (WITH OR WITHOUT PELVIS) 4+V*L*
4 series · 4 of 4 positions shown · non-contrast
Comparison: None.

CLINICAL DATA: Left hip pain.

EXAM:
DG HIP (WITH OR WITHOUT PELVIS) 4+V LEFT

[pelvis ap]
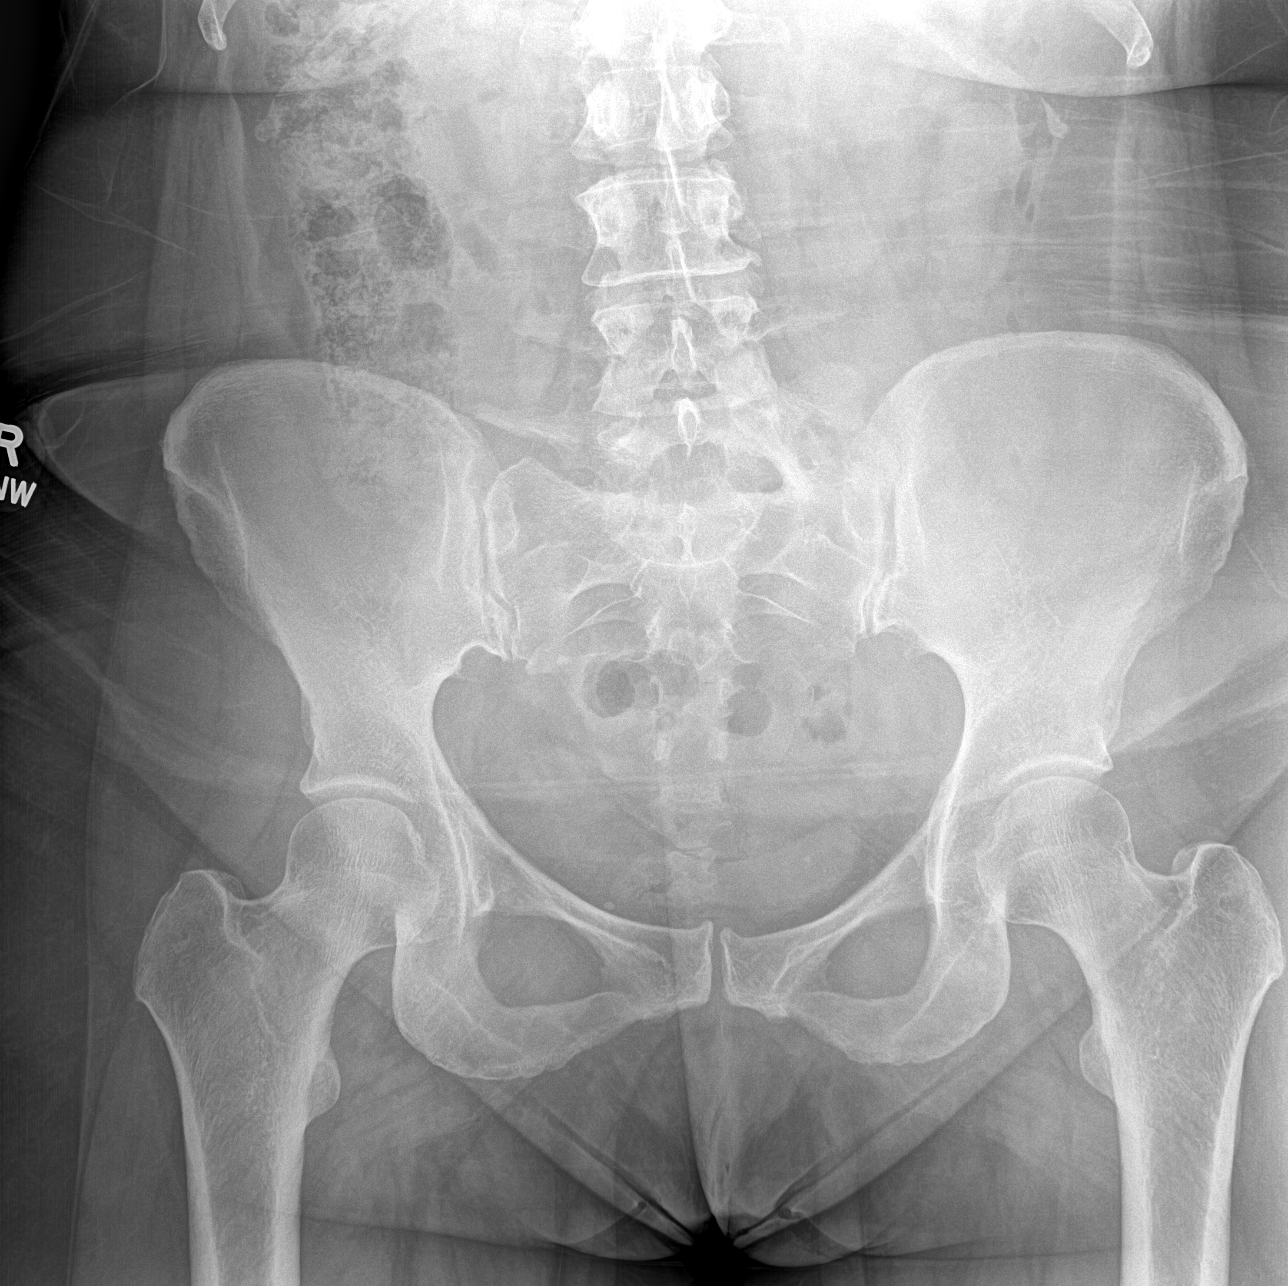

[hip ap]
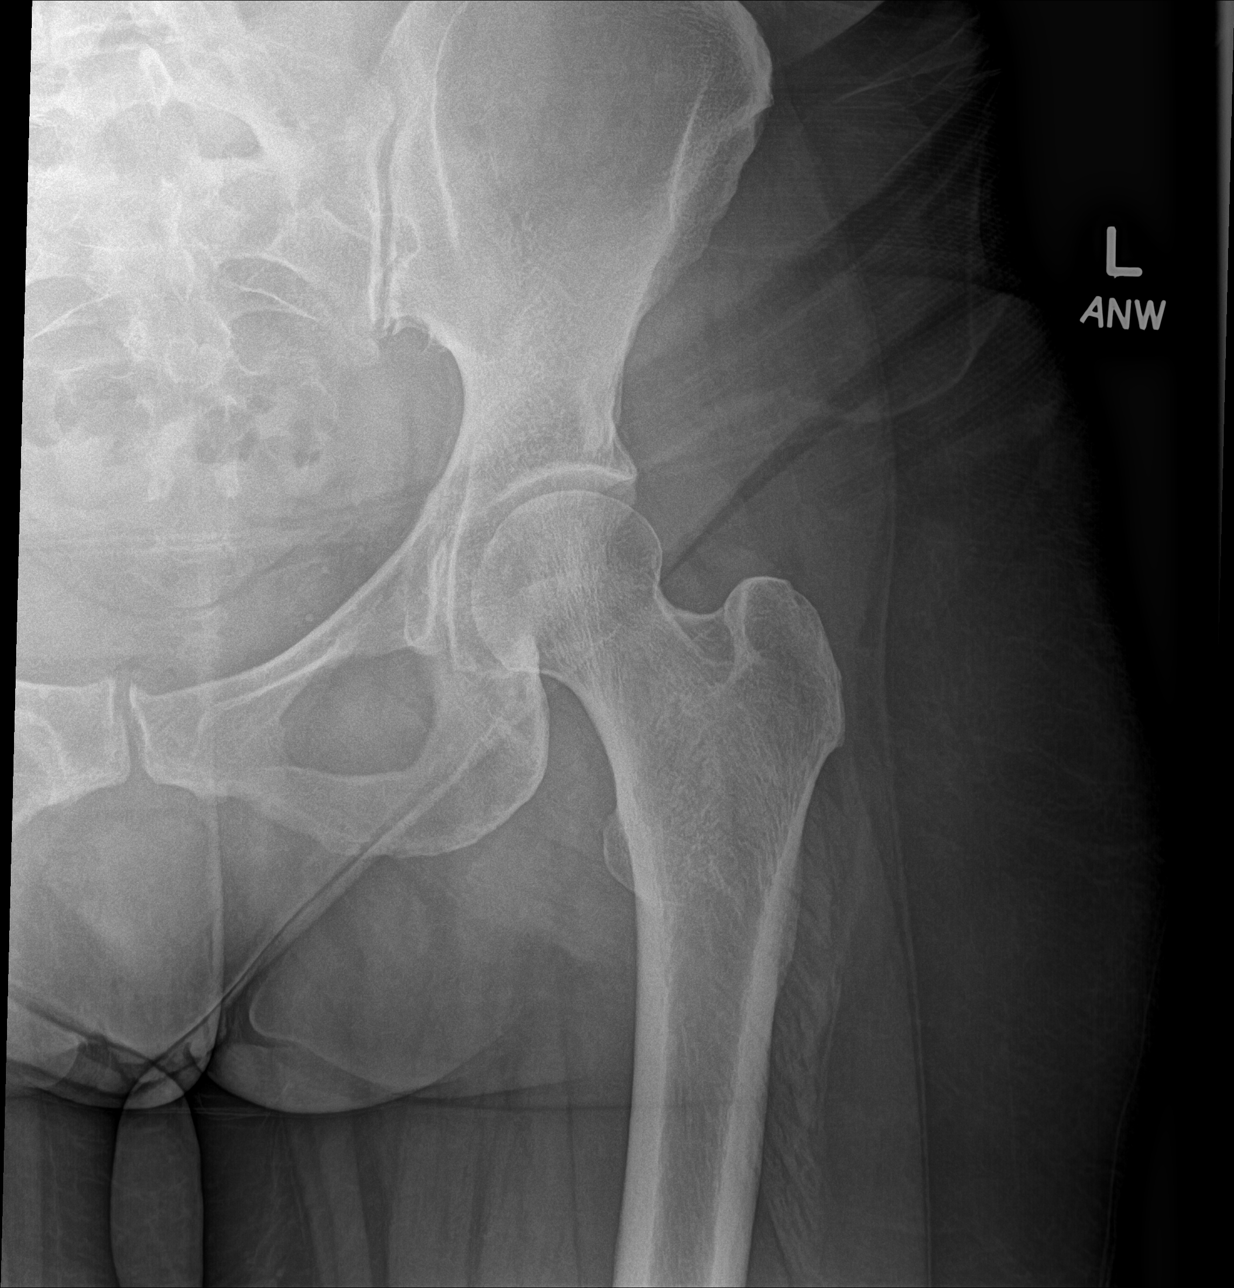

[hip lat]
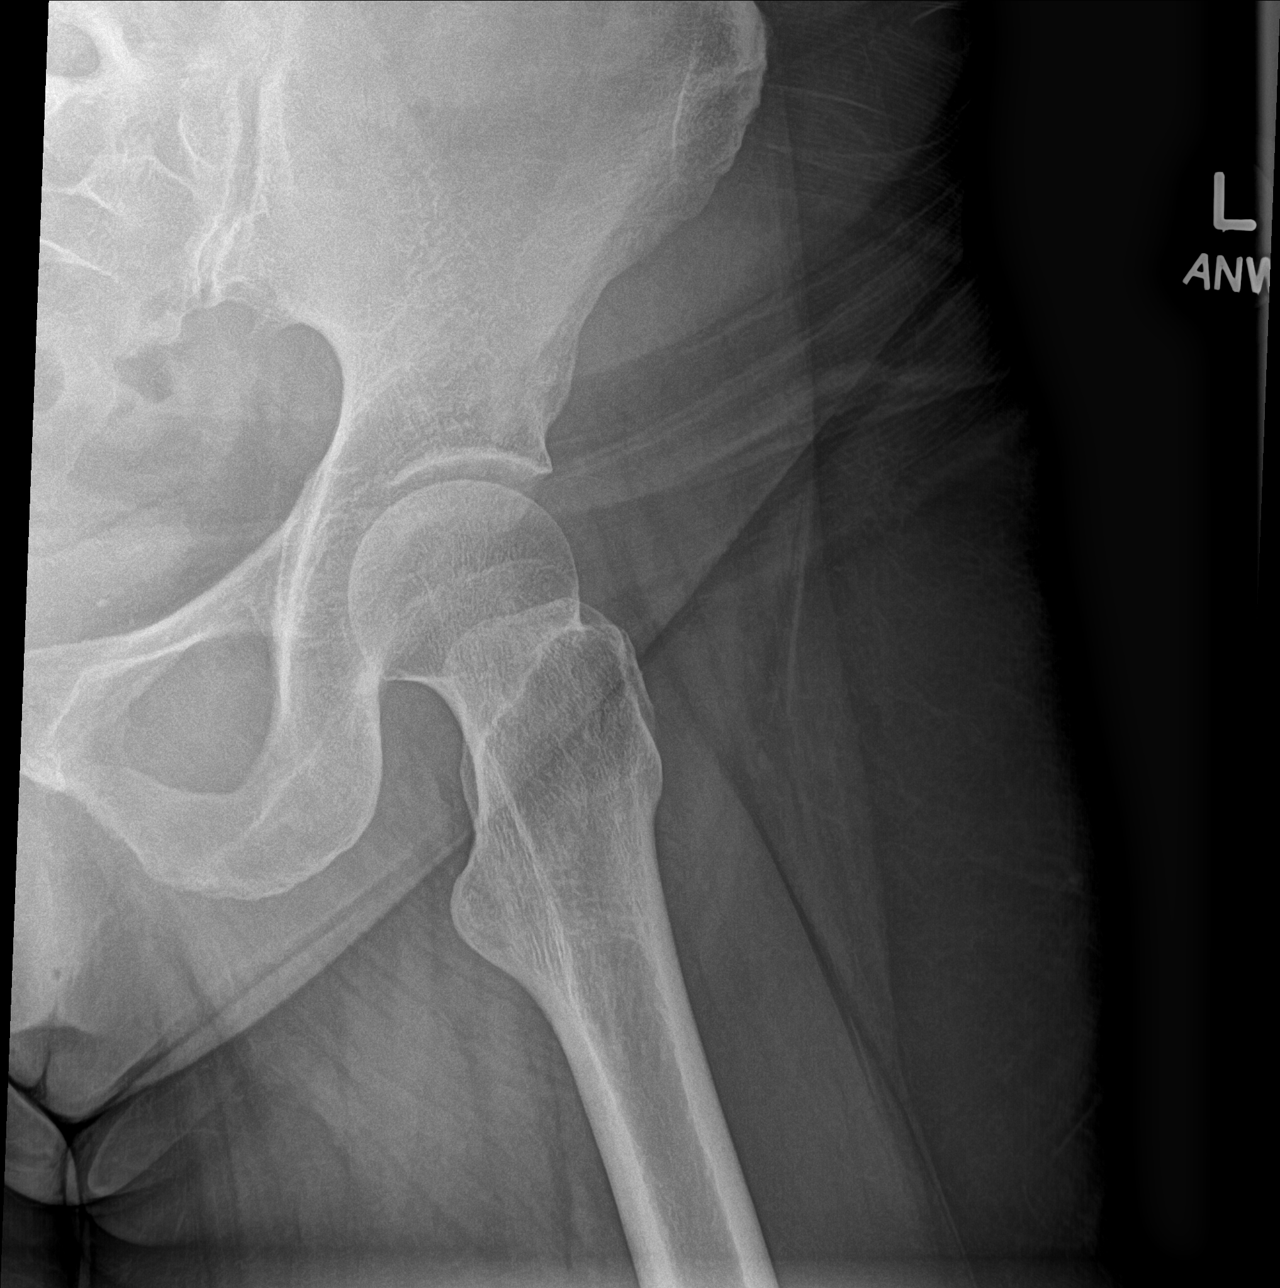

[hip axial]
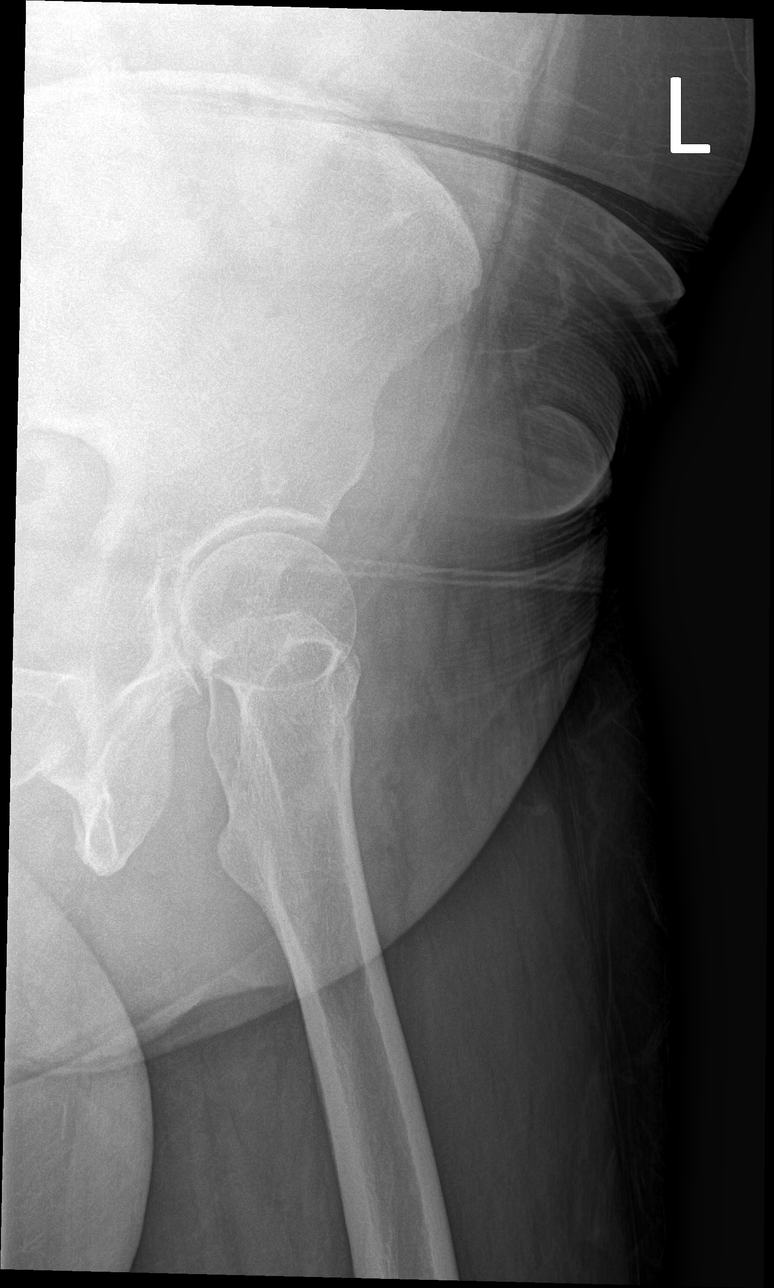

[4 of 4 positions shown; findings below may reference images not displayed]

FINDINGS: There is no evidence of hip fracture or dislocation. There is no
evidence of arthropathy or other focal bone abnormality.
IMPRESSION: Negative.

## 2023-12-04 ENCOUNTER — Other Ambulatory Visit: Payer: Self-pay | Admitting: Adult Medicine

## 2023-12-04 DIAGNOSIS — Z1231 Encounter for screening mammogram for malignant neoplasm of breast: Secondary | ICD-10-CM
